# Patient Record
Sex: Female | Born: 1935 | Race: White | Hispanic: No | Marital: Married | Smoking: Never smoker
Health system: Southern US, Community
[De-identification: ages and names within clinical notes are randomized; demographics above are authoritative.]

## PROBLEM LIST (undated history)

## (undated) DIAGNOSIS — I10 Essential (primary) hypertension: Secondary | ICD-10-CM

## (undated) DIAGNOSIS — C801 Malignant (primary) neoplasm, unspecified: Secondary | ICD-10-CM

## (undated) DIAGNOSIS — I4891 Unspecified atrial fibrillation: Secondary | ICD-10-CM

## (undated) DIAGNOSIS — I219 Acute myocardial infarction, unspecified: Secondary | ICD-10-CM

## (undated) DIAGNOSIS — M810 Age-related osteoporosis without current pathological fracture: Secondary | ICD-10-CM

## (undated) DIAGNOSIS — E785 Hyperlipidemia, unspecified: Secondary | ICD-10-CM

## (undated) DIAGNOSIS — M1990 Unspecified osteoarthritis, unspecified site: Secondary | ICD-10-CM

## (undated) DIAGNOSIS — F419 Anxiety disorder, unspecified: Secondary | ICD-10-CM

## (undated) DIAGNOSIS — D649 Anemia, unspecified: Secondary | ICD-10-CM

## (undated) DIAGNOSIS — C50919 Malignant neoplasm of unspecified site of unspecified female breast: Secondary | ICD-10-CM

## (undated) DIAGNOSIS — Z923 Personal history of irradiation: Secondary | ICD-10-CM

## (undated) DIAGNOSIS — E119 Type 2 diabetes mellitus without complications: Secondary | ICD-10-CM

## (undated) DIAGNOSIS — I2510 Atherosclerotic heart disease of native coronary artery without angina pectoris: Secondary | ICD-10-CM

## (undated) HISTORY — PX: BLADDER SURGERY: SHX569

## (undated) HISTORY — PX: ABDOMINAL HYSTERECTOMY: SHX81

## (undated) HISTORY — PX: APPENDECTOMY: SHX54

---
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
## 1998-04-08 HISTORY — PX: COLON SURGERY: SHX602

## 2004-02-07 DIAGNOSIS — I219 Acute myocardial infarction, unspecified: Secondary | ICD-10-CM

## 2004-02-07 HISTORY — DX: Acute myocardial infarction, unspecified: I21.9

## 2004-02-22 ENCOUNTER — Other Ambulatory Visit: Payer: Self-pay

## 2004-04-08 ENCOUNTER — Encounter: Payer: Self-pay | Admitting: Internal Medicine

## 2004-05-09 ENCOUNTER — Encounter: Payer: Self-pay | Admitting: Internal Medicine

## 2004-05-24 ENCOUNTER — Ambulatory Visit: Payer: Self-pay | Admitting: Internal Medicine

## 2004-06-19 ENCOUNTER — Ambulatory Visit: Payer: Self-pay

## 2004-07-20 ENCOUNTER — Inpatient Hospital Stay: Payer: Self-pay | Admitting: Surgery

## 2005-06-12 ENCOUNTER — Ambulatory Visit: Payer: Self-pay | Admitting: Internal Medicine

## 2006-06-27 ENCOUNTER — Ambulatory Visit: Payer: Self-pay | Admitting: Internal Medicine

## 2007-08-13 ENCOUNTER — Ambulatory Visit: Payer: Self-pay | Admitting: Internal Medicine

## 2007-09-10 ENCOUNTER — Other Ambulatory Visit: Payer: Self-pay

## 2007-09-10 ENCOUNTER — Inpatient Hospital Stay: Payer: Self-pay | Admitting: Internal Medicine

## 2008-08-17 ENCOUNTER — Ambulatory Visit: Payer: Self-pay | Admitting: Internal Medicine

## 2009-05-05 ENCOUNTER — Inpatient Hospital Stay: Payer: Self-pay | Admitting: Internal Medicine

## 2009-08-18 ENCOUNTER — Ambulatory Visit: Payer: Self-pay | Admitting: Internal Medicine

## 2010-08-25 ENCOUNTER — Ambulatory Visit: Payer: Self-pay | Admitting: Internal Medicine

## 2011-09-13 ENCOUNTER — Ambulatory Visit: Payer: Self-pay | Admitting: Internal Medicine

## 2012-09-16 ENCOUNTER — Ambulatory Visit: Payer: Self-pay | Admitting: Internal Medicine

## 2013-07-03 ENCOUNTER — Observation Stay: Payer: Self-pay | Admitting: Specialist

## 2013-07-03 LAB — TROPONIN I
Troponin-I: <0.02 ng/mL
Troponin-I: <0.02 ng/mL

## 2013-07-03 LAB — CBC
MCH: 30.1 pg (ref 26.0–34.0)
MCHC: 34 g/dL (ref 32.0–36.0)
Platelet: 144 10*3/uL — ABNORMAL LOW (ref 150–440)
RBC: 4.43 10*6/uL (ref 3.80–5.20)

## 2013-07-03 LAB — BASIC METABOLIC PANEL
BUN: 26 mg/dL — ABNORMAL HIGH (ref 7–18)
Calcium, Total: 9 mg/dL (ref 8.5–10.1)
Chloride: 98 mmol/L (ref 98–107)
Co2: 26 mmol/L (ref 21–32)
EGFR (Non-African Amer.): 37 — ABNORMAL LOW
Glucose: 292 mg/dL — ABNORMAL HIGH (ref 65–99)
Osmolality: 276 (ref 275–301)
Potassium: 4.1 mmol/L (ref 3.5–5.1)

## 2013-07-03 LAB — PROTIME-INR: INR: 3.7

## 2013-07-04 LAB — TROPONIN I
Troponin-I: 0.03 ng/mL
Troponin-I: 0.03 ng/mL

## 2013-07-04 LAB — PROTIME-INR: Prothrombin Time: 28 secs — ABNORMAL HIGH (ref 11.5–14.7)

## 2013-07-05 LAB — CBC WITH DIFFERENTIAL/PLATELET
Basophil #: 0 10*3/uL (ref 0.0–0.1)
Eosinophil #: 0.5 10*3/uL (ref 0.0–0.7)
Eosinophil %: 5.8 %
HCT: 37 % (ref 35.0–47.0)
HGB: 12.7 g/dL (ref 12.0–16.0)
Lymphocyte %: 9.1 %
Monocyte #: 0.6 x10 3/mm (ref 0.2–0.9)
Monocyte %: 7.8 %
Neutrophil #: 6.2 10*3/uL (ref 1.4–6.5)
Neutrophil %: 76.9 %
RBC: 4.24 10*6/uL (ref 3.80–5.20)
WBC: 8.1 10*3/uL (ref 3.6–11.0)

## 2013-07-05 LAB — BASIC METABOLIC PANEL
Anion Gap: 8 (ref 7–16)
BUN: 19 mg/dL — ABNORMAL HIGH (ref 7–18)
Co2: 25 mmol/L (ref 21–32)
Creatinine: 0.94 mg/dL (ref 0.60–1.30)
EGFR (African American): >60
EGFR (Non-African Amer.): 59 — ABNORMAL LOW
Osmolality: 266 (ref 275–301)
Potassium: 4 mmol/L (ref 3.5–5.1)
Sodium: 131 mmol/L — ABNORMAL LOW (ref 136–145)

## 2013-07-05 LAB — PROTIME-INR
INR: 2
Prothrombin Time: 22.3 secs — ABNORMAL HIGH (ref 11.5–14.7)

## 2013-07-05 LAB — DIGOXIN LEVEL: Digoxin: 0.53 ng/mL

## 2013-09-17 ENCOUNTER — Ambulatory Visit: Payer: Self-pay | Admitting: Internal Medicine

## 2013-09-23 ENCOUNTER — Ambulatory Visit: Payer: Self-pay | Admitting: Internal Medicine

## 2013-11-11 DIAGNOSIS — I2510 Atherosclerotic heart disease of native coronary artery without angina pectoris: Secondary | ICD-10-CM | POA: Insufficient documentation

## 2013-11-11 DIAGNOSIS — M1990 Unspecified osteoarthritis, unspecified site: Secondary | ICD-10-CM | POA: Insufficient documentation

## 2014-03-30 ENCOUNTER — Ambulatory Visit: Payer: Self-pay | Admitting: Internal Medicine

## 2014-09-20 ENCOUNTER — Ambulatory Visit: Payer: Self-pay | Admitting: Internal Medicine

## 2014-10-22 ENCOUNTER — Other Ambulatory Visit: Payer: Self-pay | Admitting: Internal Medicine

## 2014-10-22 DIAGNOSIS — N63 Unspecified lump in unspecified breast: Secondary | ICD-10-CM

## 2014-10-29 NOTE — Discharge Summary (Signed)
Dates of Admission and Diagnosis:  Date of Admission 03-Jul-2013   Date of Discharge 05-Jul-2013   Admitting Diagnosis SOB, palpitations, Chronic atrial fibrillation with rapid ventricular response, HTN, DM   Final Diagnosis Chronic atrial fibrillation reverted to sinus rhythm, Hypertension, Type 2 diabetes   Discharge Diagnosis 1 Chronic atrial fibrillation reverted to sinus rhythm   2 Hypertension   3 Type 2 diabetes    Chief Complaint/History of Present Illness Patient presented to the ED with c/o SOB and palpitations. h/o Chronic atrial fibrillation on chronic Coumadin therapy, HTN, DM. Patient was found to be in atrial fibrillation with rapid ventricular response, 120-140 bpm in the ED. She received i.v Digoxin and Metoprolol was increased to 75 mg po bid. Sotalol was held. Patient was admitted to telemetry for monitoring and treatment.   TDMs:  28-Dec-14 07:07   Digoxin, Serum 0.53 (Therapeutic range for digoxin in patients with atrial fibrillation: 0.8 - 2.0 ng/mL. In patients with congestive heart failure a therapeutic range of 0.5 - 0.8 ng/mL is suggested as higher levels are associated with an increased risk of toxicity without clear evidence of enhanced efficacy. Digoxin toxicity is commonly associated with serum levels > 2.0 ng/mL but may occur with lower levels, including those in the therapeutic range. Blood samples should be obtained 6-8 hours after administration to assure a reasonable volume of distribution.)  Cardiology:  27-Dec-14 08:38   Echo Doppler REASON FOR EXAM:     COMMENTS:     PROCEDURE: [facility] - ECHO DOPPLER COMPLETE(TRANSTHOR)  - Jul 04 2013  8:38AM   RESULT: Echocardiogram Report  Patient Name:   Pamela Hensley Date of Exam: 07/04/2013 Medical Rec #:  [id number]      Custom1: Date of Birth:  20-Sep-1934   Height:       61.0 in Patient Age:    80 years    Weight:       134.0 lb Patient Gender: F           BSA:          1.59 m??  Indications: Atrial  Fib Sonographer:    Arville Go RDCS Referring Phys: Emily Filbert, F  Sonographer Comments: Technically difficult study due to poor echo  windows.  Summary:  1. Left ventricular ejection fraction, by visual estimation, is 50 to  55%.  2. Normal global left ventricular systolic function.  3. Mild thickening of the anterior and posterior mitral valve leaflets. 2D AND M-MODE MEASUREMENTS (normal ranges within parentheses): Left Ventricle:          Normal IVSd (2D):      0.93 cm (0.7-1.1) LVPWd (2D):     1.02 cm (0.7-1.1) Aorta/LA:                  Normal LVIDd (2D):     4.61 cm (3.4-5.7) Aortic Root (2D): 3.10 cm (2.4-3.7) LVIDs (2D):     3.43 cm           Left Atrium (2D): 3.70 cm (1.9-4.0) LV FS (2D):     25.6 %   (>25%) LV EF (2D):     50.5 %   (>50%)                                   Right Ventricle:  RVd (2D): LV DIASTOLIC FUNCTION: MV Peak E: 0.64 m/s Decel Time: 280 msec MV Peak A: 0.79 m/s E/A Ratio: 0.81 SPECTRAL DOPPLER ANALYSIS (where applicable): Mitral Valve: MV P1/2 Time: 81.20 msec MV Area, PHT: 2.71 cm?? Aortic Valve: AoV Max Vel: 1.34 m/s AoV Peak PG: 7.2 mmHg AoV Mean PG: LVOT Vmax: 1.18 m/s LVOT VTI:  LVOT Diameter: 2.30 cm AoV Area, Vmax: 3.66 cm?? AoV Area, VTI:  AoV Area, Vmn:  PHYSICIAN INTERPRETATION: Left Ventricle: The left ventricular internal cavity size was normal. LV  septal wall thickness was normal. LV posterior wall thickness was normal.  Global LV systolic function was normal. Left ventricular ejection  fraction, by visual estimation, is 50 to 55%. Right Ventricle: The right ventricular size is normal. Global RV systolic  function is normal. Left Atrium: The left atrium is normal in size. Right Atrium: The right atrium is normal in size. Pericardium: There is no evidence of pericardial effusion. Mitral Valve: The mitral valve is normal in structure. There is mild  thickening of the anterior and posterior  mitral valve leaflets. Trace  mitral valve regurgitation is seen. Tricuspid Valve: The tricuspid valve is normal. No tricuspid  regurgitation is visualized. Aortic Valve: The aortic valve is normal. Pulmonic Valve: The pulmonic valve is normal.  [place] MD Electronically signed by [place] MD Signature Date/Time: 07/04/2013/2:14:52 PM  *** Final ***  IMPRESSION: .    Verified By: Yolonda Kida, M.D., MD  Routine Chem:  26-Dec-14 10:25   Glucose, Serum  292  BUN  26  Creatinine (comp)  1.39  Sodium, Serum  130  Potassium, Serum 4.1  Chloride, Serum 98  CO2, Serum 26  Calcium (Total), Serum 9.0  Anion Gap  6  Osmolality (calc) 276  eGFR (African American)  43  eGFR (Non-African American)  37 (eGFR values <9mL/min/1.73 m2 may be an indication of chronic kidney disease (CKD). Calculated eGFR is useful in patients with stable renal function. The eGFR calculation will not be reliable in acutely ill patients when serum creatinine is changing rapidly. It is not useful in  patients on dialysis. The eGFR calculation may not be applicable to patients at the low and high extremes of body sizes, pregnant women, and vegetarians.)  28-Dec-14 07:07   Glucose, Serum  121  BUN  19  Creatinine (comp) 0.94  Sodium, Serum  131  Potassium, Serum 4.0  Chloride, Serum 98  CO2, Serum 25  Calcium (Total), Serum 9.1  Anion Gap 8  Osmolality (calc) 266  eGFR (African American) >60  eGFR (Non-African American)  59 (eGFR values <3mL/min/1.73 m2 may be an indication of chronic kidney disease (CKD). Calculated eGFR is useful in patients with stable renal function. The eGFR calculation will not be reliable in acutely ill patients when serum creatinine is changing rapidly. It is not useful in  patients on dialysis. The eGFR calculation may not be applicable to patients at the low and high extremes of body sizes, pregnant women, and vegetarians.)  Cardiac:   26-Dec-14 10:25   Troponin I < 0.02 (0.00-0.05 0.05 ng/mL or less: NEGATIVE  Repeat testing in 3-6 hrs  if clinically indicated. >0.05 ng/mL: POTENTIAL  MYOCARDIAL INJURY. Repeat  testing in 3-6 hrs if  clinically indicated. NOTE: An increase or decrease  of 30% or more on serial  testing suggests a  clinically important change)    21:27   Troponin I < 0.02 (0.00-0.05 0.05 ng/mL or less: NEGATIVE  Repeat testing  in 3-6 hrs  if clinically indicated. >0.05 ng/mL: POTENTIAL  MYOCARDIAL INJURY. Repeat  testing in 3-6 hrs if  clinically indicated. NOTE: An increase or decrease  of 30% or more on serial  testing suggests a  clinically important change)  27-Dec-14 01:37   Troponin I 0.03 (0.00-0.05 0.05 ng/mL or less: NEGATIVE  Repeat testing in 3-6 hrs  if clinically indicated. >0.05 ng/mL: POTENTIAL  MYOCARDIAL INJURY. Repeat  testing in 3-6 hrs if  clinically indicated. NOTE: An increase or decrease  of 30% or more on serial  testing suggests a  clinically important change)    05:42   Troponin I 0.03 (0.00-0.05 0.05 ng/mL or less: NEGATIVE  Repeat testing in 3-6 hrs  if clinically indicated. >0.05 ng/mL: POTENTIAL  MYOCARDIAL INJURY. Repeat  testing in 3-6 hrs if  clinically indicated. NOTE: An increase or decrease  of 30% or more on serial  testing suggests a  clinically important change)  Routine Coag:  26-Dec-14 10:25   Prothrombin  35.2  INR 3.7 (INR reference interval applies to patients on anticoagulant therapy. A single INR therapeutic range for coumarins is not optimal for all indications; however, the suggested range for most indications is 2.0 - 3.0. Exceptions to the INR Reference Range may include: Prosthetic heart valves, acute myocardial infarction, prevention of myocardial infarction, and combinations of aspirin and anticoagulant. The need for a higher or lower target INR must be assessed individually. Reference: The Pharmacology and  Management of the Vitamin K  antagonists: the seventh ACCP Conference on Antithrombotic and Thrombolytic Therapy. WCBJS.2831 Sept:126 (3suppl): [id number]. A HCT value >55% may artifactually increase the PT.  In one study,  the increase was an average of 25%. Reference:  "Effect on Routine and Special Coagulation Testing Values of Citrate Anticoagulant Adjustment in Patients with High HCT Values." American Journal of Clinical Pathology 2006;126:400-405.)  27-Dec-14 15:12   Prothrombin  28.0  INR 2.7 (INR reference interval applies to patients on anticoagulant therapy. A single INR therapeutic range for coumarins is not optimal for all indications; however, the suggested range for most indications is 2.0 - 3.0. Exceptions to the INR Reference Range may include: Prosthetic heart valves, acute myocardial infarction, prevention of myocardial infarction, and combinations of aspirin and anticoagulant. The need for a higher or lower target INR must be assessed individually. Reference: The Pharmacology and Management of the Vitamin K  antagonists: the seventh ACCP Conference on Antithrombotic and Thrombolytic Therapy. [id number] Sept:126 (3suppl): [id number]. A HCT value >55% may artifactually increase the PT.  In one study,  the increase was an average of 25%. Reference:  "Effect on Routine and Special Coagulation Testing Values of Citrate Anticoagulant Adjustment in Patients with High HCT Values." American Journal of Clinical Pathology 2006;126:400-405.)  28-Dec-14 07:07   Prothrombin  22.3  INR 2.0 (INR reference interval applies to patients on anticoagulant therapy. A single INR therapeutic range for coumarins is not optimal for all indications; however, the suggested range for most indications is 2.0 - 3.0. Exceptions to the INR Reference Range may include: Prosthetic heart valves, acute myocardial infarction, prevention of myocardial infarction, and combinations of aspirin and  anticoagulant. The need for a higher or lower target INR must be assessed individually. Reference: The Pharmacology and Management of the Vitamin K  antagonists: the seventh ACCP Conference on Antithrombotic and Thrombolytic Therapy. [id number] Sept:126 (3suppl): [id number]. A HCT value >55% may artifactually increase the PT.  In one study,  the increase was an average of 25%. Reference:  "  Effect on Routine and Special Coagulation Testing Values of Citrate Anticoagulant Adjustment in Patients with High HCT Values." American Journal of Clinical Pathology 2006;126:400-405.)  Routine Hem:  26-Dec-14 10:25   WBC (CBC) 6.3  RBC (CBC) 4.43  Hemoglobin (CBC) 13.3  Hematocrit (CBC) 39.2  Platelet Count (CBC)  144 (Result(s) reported on 03 Jul 2013 at 10:40AM.)  MCV 89  MCH 30.1  MCHC 34.0  RDW 13.5  28-Dec-14 07:07   WBC (CBC) 8.1  RBC (CBC) 4.24  Hemoglobin (CBC) 12.7  Hematocrit (CBC) 37.0  Platelet Count (CBC)  115  MCV 87  MCH 29.9  MCHC 34.3  RDW 13.3  Neutrophil % 76.9  Lymphocyte % 9.1  Monocyte % 7.8  Eosinophil % 5.8  Basophil % 0.4  Neutrophil # 6.2  Lymphocyte #  0.7  Monocyte # 0.6  Eosinophil # 0.5  Basophil # 0.0 (Result(s) reported on 05 Jul 2013 at 08:45AM.)   Hospital Course:  Hospital Course She remained hemodynamically stable. Coumadin was held for INR of 3.7 on admission. Patient reverted to normal sinus rhythm the next morning. Oral Cardizem was not needed. ECHO revealed EF=50-55%. Patient remained in sinus rhythm at 60-70 bpm during the rest of her hospital stay with normal BP readings. DM was controlled.   Condition on Discharge Stable   DISCHARGE INSTRUCTIONS HOME MEDS:  Medication Reconciliation: Patient's Home Medications at Discharge:     Medication Instructions  lisinopril 40 mg oral tablet  0.5 tab(s) orally once a day   metformin 500 mg oral tablet  1 tab(s) orally once a day with food.   warfarin 6 mg oral tablet  1 tab(s) orally once a day  (at bedtime)   simvastatin 40 mg oral tablet  1 tab(s) orally once a day (at bedtime) for cholesterol.   aspirin enteric coated 81 mg oral delayed release tablet  1 tab(s) orally once a day   venlafaxine 75 mg oral tablet  1 tab(s) orally once a day   acetaminophen 325 mg oral tablet  2 tab(s) orally every 4 hours, As needed, pain or temp. greater than 100.4   digoxin 125 mcg (0.125 mg) oral tablet  1 tab(s) orally once a day   metoprolol tartrate 25 mg oral tablet  3 tab(s) orally every 12 hours    PRESCRIPTIONS: ELECTRONICALLY SUBMITTED   Physician's Instructions:  Diet Low Sodium  Carbohydrate Controlled (ADA) Diet   Activity Limitations As tolerated   Return to Work Not Applicable   Time frame for Follow Up Appointment 1-2 weeks  Dr. Emily Filbert   Electronic Signatures: Glendon Axe (MD)  (Signed 28-Dec-14 11:30)  Authored: ADMISSION DATE AND DIAGNOSIS, CHIEF COMPLAINT/HPI, PERTINENT [place] MEDS, PATIENT INSTRUCTIONS   Last Updated: 28-Dec-14 11:30 by Glendon Axe (MD)

## 2014-10-30 NOTE — H&P (Signed)
PATIENT NAME:  Pamela Hensley, Pamela Hensley MR#:  [id number] DATE OF BIRTH:  [birth year]  DATE OF ADMISSION:  07/03/2013  PRIMARY CARE PHYSICIAN: Dr. Emily Filbert.   CHIEF COMPLAINT: Palpitations and shortness of breath.   HISTORY OF PRESENT ILLNESS: This is a 80 year old female who presents to the hospital due to palpitations and shortness of breath, ongoing for the past 2 to 3 days. The patient has a history of chronic atrial fibrillation. She was told by her primary care physician that if her rate is uncontrolled to take an extra dose of sotalol and lower one of her blood pressure meds. She attempted to do that, but she continued to have significant palpitations on exertion and shortness of breath. She, therefore, came to the ER for further evaluation and was noted to be in uncontrolled Afib with heart rates in the 120s to 140s. She received some IV metoprolol, but her rate still continued to be high and hospitalist service was contacted for further treatment and evaluation. The patient said she had an episode of palpitations and shortness of breath about 3 weeks ago and she attempted to go up on her sotalol which seemed to have alleviated her symptoms, although they have recurred now 3 weeks later again. She denies any chest pain. She denies any paroxysmal nocturnal dyspnea, any orthopnea, any nausea, vomiting, syncope, diaphoresis or any other associated symptoms presently.   REVIEW OF SYSTEMS:   CONSTITUTIONAL: No documented fever. No weight gain. No weight loss.  EYES: No blurry or double vision.  ENT: No tinnitus. No postnasal drip. No redness of the oropharynx.  RESPIRATORY: No cough. No wheeze. No hemoptysis. Positive dyspnea on exertion.  CARDIOVASCULAR: No chest pain. No orthopnea. Positive palpitations. No syncope.  GASTROINTESTINAL: No nausea. No vomiting. No diarrhea. No abdominal pain. No melena or hematochezia.  GENITOURINARY: No dysuria or hematuria.  ENDOCRINE: No polyuria, nocturia, heat or cold  intolerance.  HEMATOLOGIC: No anemia. No bruising. No bleeding.  INTEGUMENTARY: No rashes. No lesions.  MUSCULOSKELETAL: No arthritis. No swelling. No gout.  NEUROLOGIC: No numbness. No tingling. No ataxia. No seizure-type activity.  PSYCHIATRIC: No anxiety. No insomnia. No ADD.   PAST MEDICAL HISTORY: Consistent with chronic Afib, hypertension, diabetes, hyperlipidemia, depression.   ALLERGIES: No known drug allergies.   SOCIAL HISTORY: No smoking. No alcohol abuse. No illicit drug abuse. Lives at home with her husband.   FAMILY HISTORY: Both mother and father are deceased. Mother died from [place]. Father died from old age.   CURRENT MEDICATIONS: Aspirin 81 mg daily, HCTZ 25 mg daily, lisinopril 20 mg daily, metformin 500 mg daily, metoprolol tartrate 50 mg b.i.d., simvastatin 40 mg at bedtime, sotalol 80 mg t.i.d., Effexor 75 mg daily and Coumadin 6 mg at bedtime.   PHYSICAL EXAMINATION: Presently is as follows: VITAL SIGNS: Not to be temperature is 96, pulse 105, respirations 18, blood pressure 111/63, sats 96% on room air.  GENERAL: The patient is a pleasant-appearing female in no apparent distress.  HEAD, EYES, EARS, NOSE, THROAT: She is atraumatic, normocephalic. Extraocular muscles are intact. Pupils equal and reactive to light. Sclerae are anicteric. No conjunctival injection. No pharyngeal erythema.  NECK: Supple. There is no jugular venous distention. No bruits. No lymphadenopathy or thyromegaly.  HEART: Irregularly irregular. No murmurs. No rubs. No clicks.  LUNGS: Clear to auscultation bilaterally. No rales or rhonchi. No wheezes.  ABDOMEN: Soft, flat, nontender, nondistended. Has good bowel sounds. No hepatosplenomegaly appreciated.  EXTREMITIES: No evidence of any cyanosis, clubbing or peripheral edema. Has +  2 pedal and radial pulses bilaterally.  NEUROLOGICAL: The patient is alert, awake and oriented x 3 with no focal motor or sensory deficits appreciated bilaterally.   SKIN: Moist and warm with no rashes appreciated.  LYMPHATIC: There is no cervical or axillary lymphadenopathy.   LABORATORY EXAM: Shows a serum glucose of 292, BUN 26, creatinine 1.3, sodium 130, potassium 4.1, chloride 98, bicarb 26. The patient's troponin is less than 0.02. White cell count 6.3, hemoglobin 13.3, hematocrit 39.2, platelet count of 144. INR is 3.7.   EKG shows atrial fibrillation with rapid ventricular response.   ASSESSMENT AND PLAN: This is a 80 year old female with a history of chronic atrial fibrillation, hypertension, depression, diabetes. Presents to the hospital due to uncontrolled atrial fibrillation.  1. Atrial fibrillation: The patient has chronic atrial fibrillation. Her rate is uncontrolled. She apparently has been on sotalol for rate control, although is not improving her symptoms. For now, I will increase her oral metoprolol, add some intravenous digoxin. Also place her on some low-dose Cardizem and add p.r.n. intravenous Cardizem for rates greater than 140. I will get a cardiology consult. Discussed the case with Dr. Clayborn Bigness who will see the patient tomorrow. I will get a 2-dimensional echocardiogram. The patient clinically does not appear be in congestive heart failure. The patient is already on Coumadin. INR is therapeutic. I will hold Coumadin for now.  2. Hypertension: The patient is hemodynamically stable presently. I will continue her metoprolol.  3. Depression: Continue Effexor.  4. Diabetes: Continue her metformin and sliding scale insulin and a carbohydrate-controlled diet. Follow blood sugars.  5. Hyperlipidemia: Continue simvastatin.   The patient is a FULL CODE.   TIME SPENT: 50 minutes.    ____________________________ Belia Heman. Verdell Carmine, MD vjs:gb D: 07/03/2013 20:16:30 ET T: 07/03/2013 22:51:52 ET JOB#: [id number]  cc: Belia Heman. Verdell Carmine, MD, <Dictator> Henreitta Leber MD ELECTRONICALLY SIGNED 08/05/2013 11:16

## 2014-10-30 NOTE — Consult Note (Signed)
PATIENT NAME:  Pamela Hensley, Pamela Hensley MR#:  [id number] DATE OF BIRTH:  07/24/1934  DATE OF CONSULTATION:  07/04/2013  REFERRING PHYSICIAN:  Emily Filbert, M.D. CONSULTING PHYSICIAN:  Carita Sollars D. Tyronne Blann, MD  INDICATION:  Palpitations, shortness of breath.   HISTORY OF PRESENT ILLNESS:  Ms. Pamela Hensley is a 80 year old white female with a history of palpitations, shortness of breath, ongoing for the last 2 to 3 days.  The patient states that she has chronic atrial fibrillation.  She was told by primary that if her rate was uncontrolled to taken an extra dose of Sotalol and lower her blood pressure medications.  The patient attempted do so, but she continued to have significant palpitations on exertion, shortness of breath.  Therefore, came to the ER for further evaluation and was noted to have uncontrolled A-Fib and heart rates of 120s to 140s.  She received some IV metoprolol, but her rate still continued to be high and the hospitalist service was contacted for further evaluation.  The patient said that she had an episode of palpitations, shortness of breath about three weeks ago and attempted to go up on the sotalol which seemed to have alleviated symptoms, but now it has recurred again.  She denies any worsening dyspnea, shortness of breath and no recent problems with anticoagulation which she has been on consistently.   REVIEW OF SYSTEMS:   syncope.  Denies nausea, vomiting.  Denies fever, chills, sweats.  No weight loss.  No weight gain.  No hemoptysis or hematemesis.  She denies bright red blood per rectum.  No vision change or hearing change.  Denies sputum production or cough.  Again, she complains of palpitations, tachycardia, vague chest pain related to the palpitations.   PAST MEDICAL HISTORY:  Chronic atrial fibrillation, hypertension, diabetes, hyperlipidemia, depression.   ALLERGIES:  None.   SOCIAL HISTORY:  No smoking or alcohol abuse.  No illicit drug use.  Lives with her husband.   FAMILY HISTORY:   Mother and father died from [place].  MEDICATIONS AT HOME:  Aspirin 81 mg a day, HCTZ 25 daily, Lexapro 20 mg daily, metformin 500 mg daily, metoprolol 50 mg twice daily, simvastatin 40 at bedtime, Sotalol 80 twice daily, Effexor 75 daily, Coumadin 6 mg at bedtime.   PHYSICAL EXAMINATION: VITAL SIGNS:  Blood pressure was 120/60, pulse of about 100 and irregular, respiratory rate of 14, afebrile.  HEENT:  Normocephalic, atraumatic.  Pupils equal and reactive to light.  NECK:  Supple.  No significant JVD, bruits or adenopathy.  LUNGS:  Clear to auscultation and percussion.  No significant wheezing, rhonchi, or rales.  HEART:  Irregularly irregular, systolic ejection murmur at left sternal border. ABDOMEN:  Benign.  NEUROLOGIC:  Intact.  SKIN:  Normal.   LABORATORY, DIAGNOSTIC, AND RADIOLOGICAL DATA:  Glucose of 292, BUN 26, creatinine 1.3, sodium 130, potassium 4.1, chloride of 98, bicarb 26.  Troponin less than 0.2.  White count of 6, hemoglobin 13, hematocrit 39, platelet count of 144.  INR 3.7.  EKG:  Atrial fibrillation with rapid ventricular response.   ASSESSMENT:  1.  Chronic atrial fibrillation with rapid ventricular response.  2.  Chest pain.  3.  Hypertension.  4.  Diabetes.  5.  Depression.  6.  Hyperlipidemia.   PLAN:   1.  The patient has had chronic atrial fibrillation on anticoagulation, did attempt to control with sotalol with marginal success.  She has had some success with rate control, but not rhythm control.  She has been reasonably asymptomatic  until recently.  Now the question is for better control should we increase her sotalol to 120 twice a day or switch to a different medication for rhythm control.  Should we increase her metoprolol or add another rate controlling agent.  We will probably continue Coumadin as we are doing for anticoagulation.  Would also consider whether cardioversion would be helpful.  Echocardiogram would help in this instance and then make a  determination, we would consider whether EP evaluation would be helpful.  Do not get the sense that ablation would be a likely alternative.  2.  Hypertension.  We will continue possibly advancing blood pressure medications including metoprolol.  We will consider whether Cardizem will be helpful or some other agent.   3.  For chest pain, we will probably recommend a noninvasive functional study.  Some of the chest pain could be related to her palpitations  must consider with the coronary artery disease that is underlying some of her symptoms.  4.  For hyperlipidemia, continue simvastatin therapy.  5.  For diabetes, continue metformin.  6.  For depression, Effexor.    Again, hopefully we will proceed with a noninvasive outpatient evaluation and then consider whether further atrial fibrillation or EP evaluation is necessary as well.  Once her rate is better controlled we will make some decisions about her rhythm.    ____________________________ Loran Senters. Clayborn Bigness, MD ddc:ea D: 07/08/2013 02:58:19 ET T: 07/08/2013 03:46:38 ET JOB#: [id number]  cc: Shayne Diguglielmo D. Clayborn Bigness, MD, <Dictator> Yolonda Kida MD ELECTRONICALLY SIGNED 08/06/2013 16:22

## 2015-03-24 ENCOUNTER — Ambulatory Visit
Admission: RE | Admit: 2015-03-24 | Discharge: 2015-03-24 | Disposition: A | Discharge status: Home / Self Care | Payer: Medicare Other | Source: Ambulatory Visit | Attending: Internal Medicine | Admitting: Internal Medicine

## 2015-03-24 DIAGNOSIS — R928 Other abnormal and inconclusive findings on diagnostic imaging of breast: Secondary | ICD-10-CM | POA: Diagnosis present

## 2015-03-24 DIAGNOSIS — N63 Unspecified lump in unspecified breast: Secondary | ICD-10-CM

## 2015-04-06 ENCOUNTER — Encounter: Payer: Self-pay | Admitting: *Deleted

## 2015-04-11 ENCOUNTER — Encounter: Payer: Self-pay | Admitting: Anesthesiology

## 2015-04-13 NOTE — Discharge Instructions (Signed)

## 2015-04-15 ENCOUNTER — Ambulatory Visit
Admission: RE | Admit: 2015-04-15 | Discharge: 2015-04-15 | Disposition: A | Discharge status: Home / Self Care | Payer: Medicare Other | Source: Ambulatory Visit | Attending: Unknown Physician Specialty | Admitting: Unknown Physician Specialty

## 2015-04-15 ENCOUNTER — Encounter
Admission: RE | Disposition: A | Discharge status: Home / Self Care | Payer: Self-pay | Source: Ambulatory Visit | Attending: Unknown Physician Specialty

## 2015-04-15 DIAGNOSIS — I252 Old myocardial infarction: Secondary | ICD-10-CM | POA: Insufficient documentation

## 2015-04-15 DIAGNOSIS — Z9049 Acquired absence of other specified parts of digestive tract: Secondary | ICD-10-CM | POA: Insufficient documentation

## 2015-04-15 DIAGNOSIS — I10 Essential (primary) hypertension: Secondary | ICD-10-CM | POA: Diagnosis not present

## 2015-04-15 DIAGNOSIS — M1990 Unspecified osteoarthritis, unspecified site: Secondary | ICD-10-CM | POA: Insufficient documentation

## 2015-04-15 DIAGNOSIS — R221 Localized swelling, mass and lump, neck: Secondary | ICD-10-CM | POA: Diagnosis present

## 2015-04-15 DIAGNOSIS — I2510 Atherosclerotic heart disease of native coronary artery without angina pectoris: Secondary | ICD-10-CM | POA: Diagnosis not present

## 2015-04-15 DIAGNOSIS — L728 Other follicular cysts of the skin and subcutaneous tissue: Secondary | ICD-10-CM | POA: Diagnosis not present

## 2015-04-15 DIAGNOSIS — Z7982 Long term (current) use of aspirin: Secondary | ICD-10-CM | POA: Insufficient documentation

## 2015-04-15 DIAGNOSIS — Z90710 Acquired absence of both cervix and uterus: Secondary | ICD-10-CM | POA: Diagnosis not present

## 2015-04-15 DIAGNOSIS — E785 Hyperlipidemia, unspecified: Secondary | ICD-10-CM | POA: Insufficient documentation

## 2015-04-15 DIAGNOSIS — M810 Age-related osteoporosis without current pathological fracture: Secondary | ICD-10-CM | POA: Diagnosis not present

## 2015-04-15 DIAGNOSIS — I4891 Unspecified atrial fibrillation: Secondary | ICD-10-CM | POA: Diagnosis not present

## 2015-04-15 HISTORY — DX: Unspecified osteoarthritis, unspecified site: M19.90

## 2015-04-15 HISTORY — DX: Hyperlipidemia, unspecified: E78.5

## 2015-04-15 HISTORY — DX: Acute myocardial infarction, unspecified: I21.9

## 2015-04-15 HISTORY — PX: MASS EXCISION: SHX2000

## 2015-04-15 HISTORY — DX: Anemia, unspecified: D64.9

## 2015-04-15 HISTORY — DX: Age-related osteoporosis without current pathological fracture: M81.0

## 2015-04-15 HISTORY — DX: Unspecified atrial fibrillation: I48.91

## 2015-04-15 HISTORY — DX: Essential (primary) hypertension: I10

## 2015-04-15 HISTORY — DX: Type 2 diabetes mellitus without complications: E11.9

## 2015-04-15 LAB — GLUCOSE, CAPILLARY
Glucose-Capillary: 145 mg/dL — ABNORMAL HIGH (ref 65–99)
Glucose-Capillary: 150 mg/dL — ABNORMAL HIGH (ref 65–99)

## 2015-04-15 SURGERY — MINOR EXCISION OF MASS
Anesthesia: LOCAL | Laterality: Right | Wound class: Clean

## 2015-04-15 MED ORDER — LIDOCAINE-EPINEPHRINE 1 %-1:100000 IJ SOLN
INTRAMUSCULAR | Status: DC | PRN
  Administered 2015-04-15: 3 mL

## 2015-04-15 SURGICAL SUPPLY — 27 items
APPLICATOR COTTON TIP WD 3 STR (MISCELLANEOUS) IMPLANT
BLADE SURG 15 STRL LF DISP TIS (BLADE) IMPLANT
BLADE SURG 15 STRL SS (BLADE)
CANISTER SUCT 1200ML W/VALVE (MISCELLANEOUS) IMPLANT
CORD BIP STRL DISP 12FT (MISCELLANEOUS) ×2 IMPLANT
DRAPE HEAD BAR (DRAPES) IMPLANT
DRESSING TELFA 4X3 1S ST N-ADH (GAUZE/BANDAGES/DRESSINGS) IMPLANT
ELECT CAUTERY BLADE TIP 2.5 (TIP)
ELECT CAUTERY NEEDLE 2.0 MIC (NEEDLE) IMPLANT
ELECTRODE CAUTERY BLDE TIP 2.5 (TIP) IMPLANT
GAUZE SPONGE 4X4 12PLY STRL (GAUZE/BANDAGES/DRESSINGS) IMPLANT
GLOVE BIO SURGEON STRL SZ7.5 (GLOVE) ×4 IMPLANT
LIQUID BAND (GAUZE/BANDAGES/DRESSINGS) IMPLANT
NEEDLE HYPO 25GX1X1/2 BEV (NEEDLE) ×2 IMPLANT
NS IRRIG 500ML POUR BTL (IV SOLUTION) ×2 IMPLANT
PACK DRAPE NASAL/ENT (PACKS) ×2 IMPLANT
PAD GROUND ADULT SPLIT (MISCELLANEOUS) IMPLANT
PENCIL ELECTRO HAND CTR (MISCELLANEOUS) IMPLANT
SOL PREP PVP 2OZ (MISCELLANEOUS)
SOLUTION PREP PVP 2OZ (MISCELLANEOUS) IMPLANT
STRAP BODY AND KNEE 60X3 (MISCELLANEOUS) ×2 IMPLANT
SUCTION FRAZIER TIP 10 FR DISP (SUCTIONS) IMPLANT
SUT PROLENE 5 0 P 3 (SUTURE) IMPLANT
SUT VIC AB 4-0 RB1 27 (SUTURE) ×1
SUT VIC AB 4-0 RB1 27X BRD (SUTURE) ×1 IMPLANT
SYRINGE 10CC LL (SYRINGE) ×2 IMPLANT
TOWEL OR 17X26 4PK STRL BLUE (TOWEL DISPOSABLE) ×2 IMPLANT

## 2015-04-15 NOTE — H&P (Signed)
  H+P  Reviewed and will be scanned in later. No changes noted. 

## 2015-04-15 NOTE — Op Note (Signed)
04/15/2015  9:46 AM    Octavia Bruckner  [id number]   Pre-Op Dx: NECK MASS  Post-op Dx: SAME  Proc: Excision of 1 x 2 cm right anterior neck mass   Surg:  Beverly Gust T  Anes:  GOT  EBL:  Less than 5 cc  Comp:  None  Findings:  Approximately 1 x 2 cm superficial anterior neck mass on the right  Procedure: Miss Releford was done from the holding area taken the operating room placed in supine position. There was a mass located in the subcutaneous taste tissues of the right neck and local anesthetic of 1% lidocaine with 1 100,000 units epinephrine is used to inject around the cystic mass a total of 3 cc was used of the neck was then prepped and draped sterilely and elliptical incision was created around the mass within a natural skin tension line. A 15 blade was used to perform this incision. The subcutaneous tissues were dissected using short sharp scissors and the microbipolar. This was taken down in the subcutaneous fat and remove the cystic mass in its entirety. The wounds and copious irrigated with saline any small bleeding points were cauterized using the microbipolar. The subcutaneous taste tissues were closed using 4-0 Vicryl and the skin was closed with interrupted 4-0 nylon. The patient was then taken from the operating room to the recovery room in stable condition  Dispo:   Good  Specimen: Right anterior neck mass  Cultures: None  Plan:  She will be discharged home follow up in 1 week for suture removal  Rainah Kirshner T  04/15/2015 9:46 AM

## 2015-04-15 NOTE — OR Nursing (Signed)
Patients before procedure start BP- 98/52 Patients after procedure BP- 100/54

## 2015-04-18 ENCOUNTER — Encounter: Payer: Self-pay | Admitting: Unknown Physician Specialty

## 2015-04-19 LAB — SURGICAL PATHOLOGY

## 2015-08-24 DIAGNOSIS — E119 Type 2 diabetes mellitus without complications: Secondary | ICD-10-CM | POA: Insufficient documentation

## 2015-08-31 ENCOUNTER — Other Ambulatory Visit: Payer: Self-pay | Admitting: Internal Medicine

## 2015-08-31 DIAGNOSIS — R920 Mammographic microcalcification found on diagnostic imaging of breast: Secondary | ICD-10-CM

## 2015-09-23 ENCOUNTER — Ambulatory Visit
Admission: RE | Admit: 2015-09-23 | Discharge: 2015-09-23 | Disposition: A | Discharge status: Home / Self Care | Payer: Medicare Other | Source: Ambulatory Visit | Attending: Internal Medicine | Admitting: Internal Medicine

## 2015-09-23 DIAGNOSIS — R920 Mammographic microcalcification found on diagnostic imaging of breast: Secondary | ICD-10-CM | POA: Diagnosis present

## 2015-09-23 DIAGNOSIS — R921 Mammographic calcification found on diagnostic imaging of breast: Secondary | ICD-10-CM | POA: Diagnosis not present

## 2016-02-23 DIAGNOSIS — E782 Mixed hyperlipidemia: Secondary | ICD-10-CM | POA: Insufficient documentation

## 2016-03-14 IMAGING — MG MM ADDITIONAL VIEWS AT NO CHARGE
4 series · 8 of 8 positions shown · non-contrast
Comparison: With priors.

CLINICAL DATA: Abnormal right screening mammogram.

EXAM:
DIGITAL DIAGNOSTIC  RIGHT MAMMOGRAM

[R CC · right · 4 of 4 slices shown (1 of 2)]
[im 1/4]
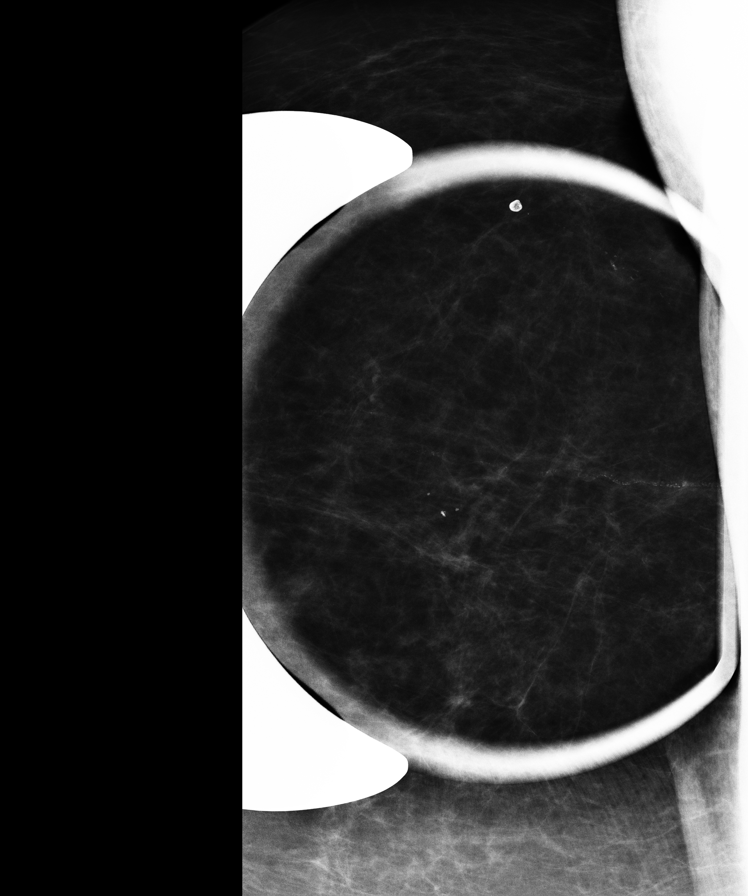
[im 2/4]
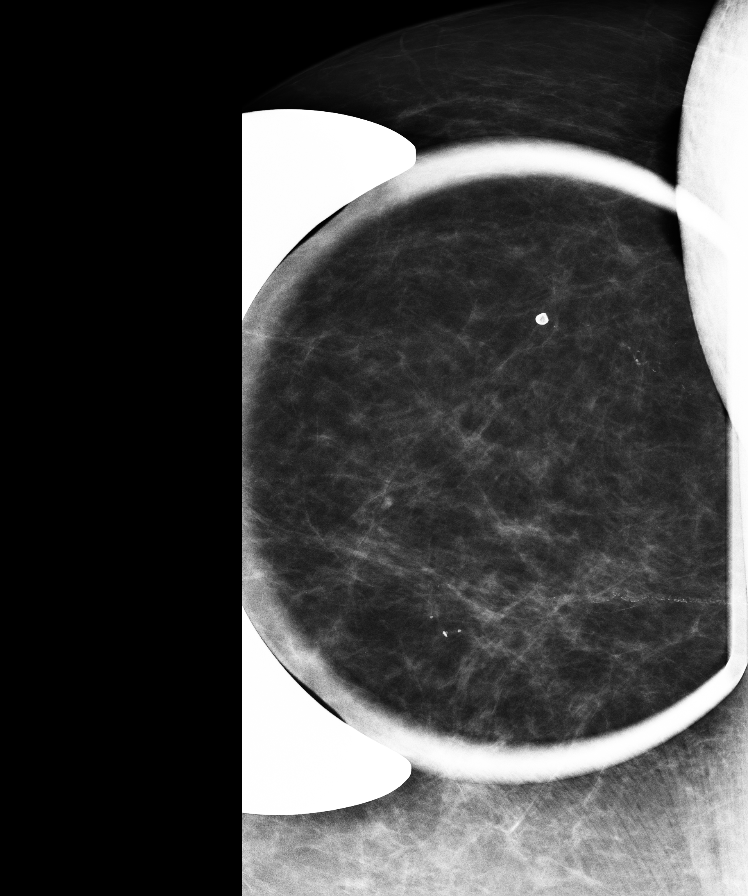
[im 3/4]
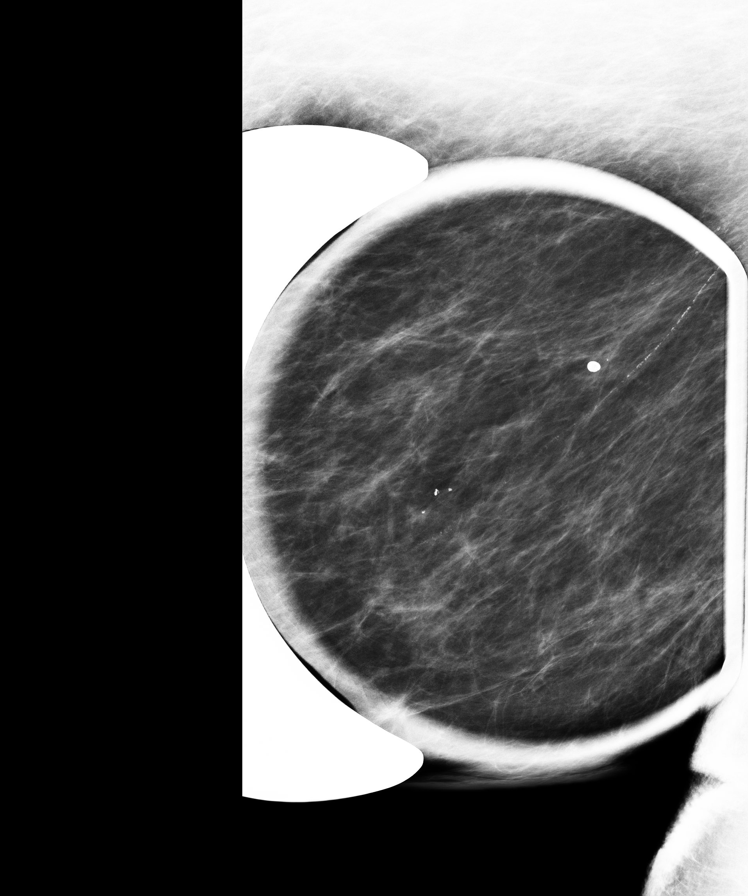
[im 4/4]
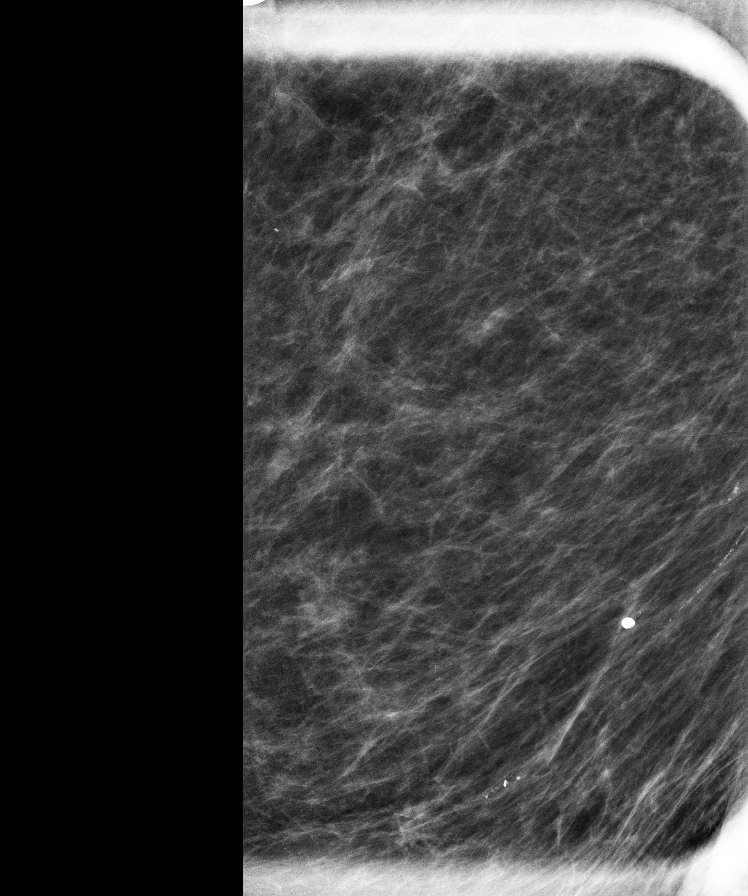

[R ML · right · 2 of 2 slices shown (1 of 2)]
[im 1/2]
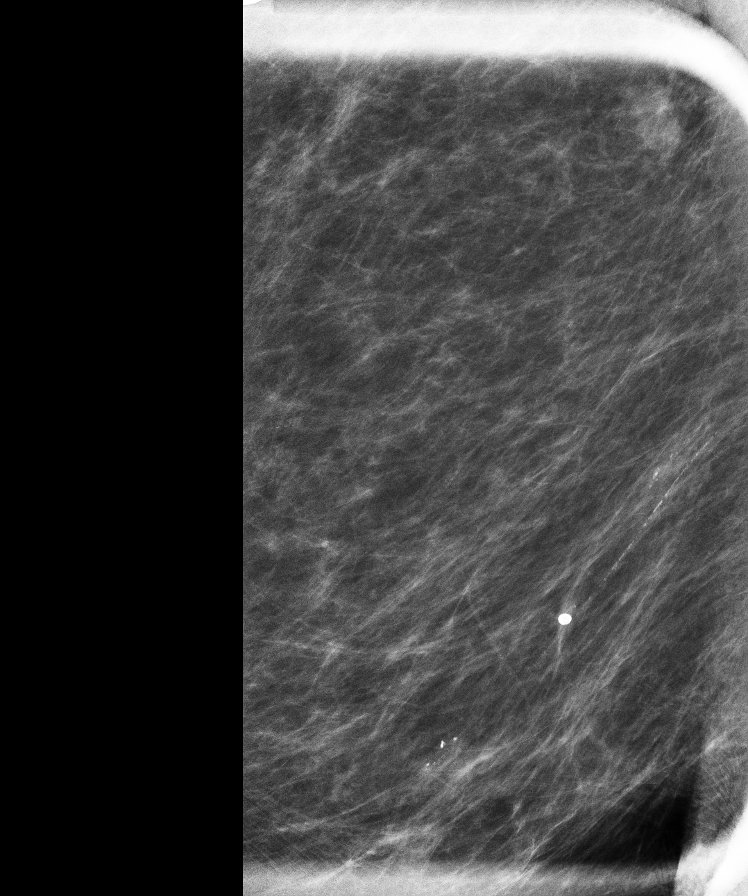
[im 2/2]
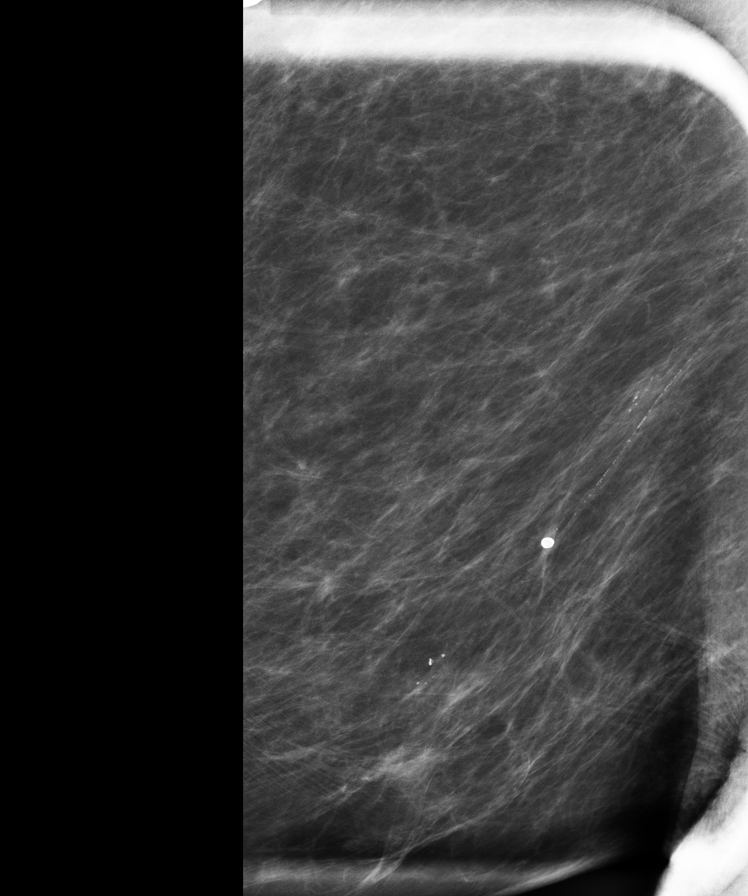

[R CC (2 of 2)]
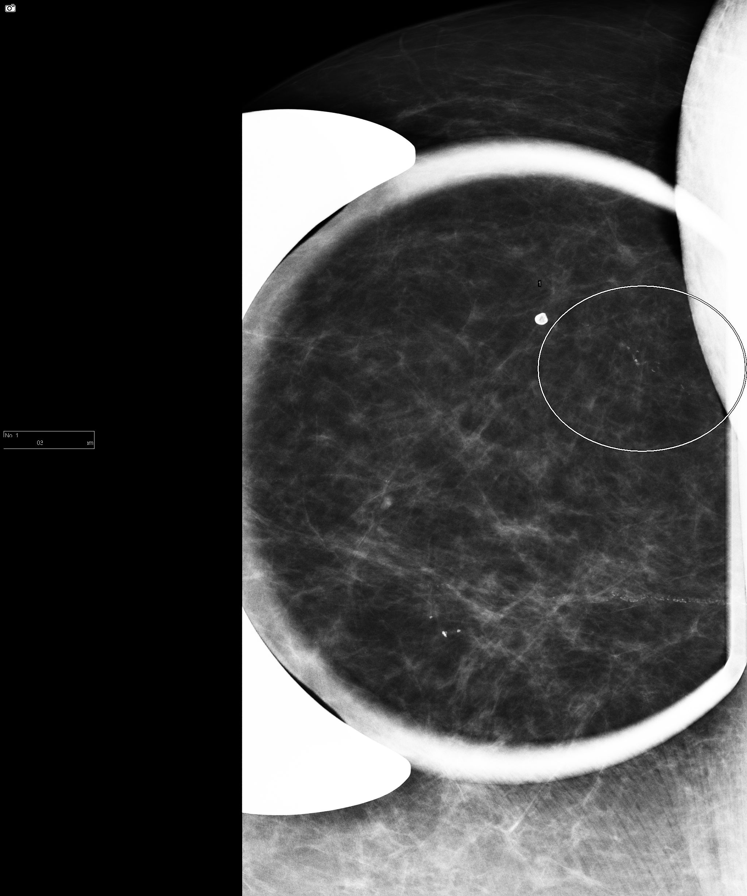

[R ML (2 of 2)]
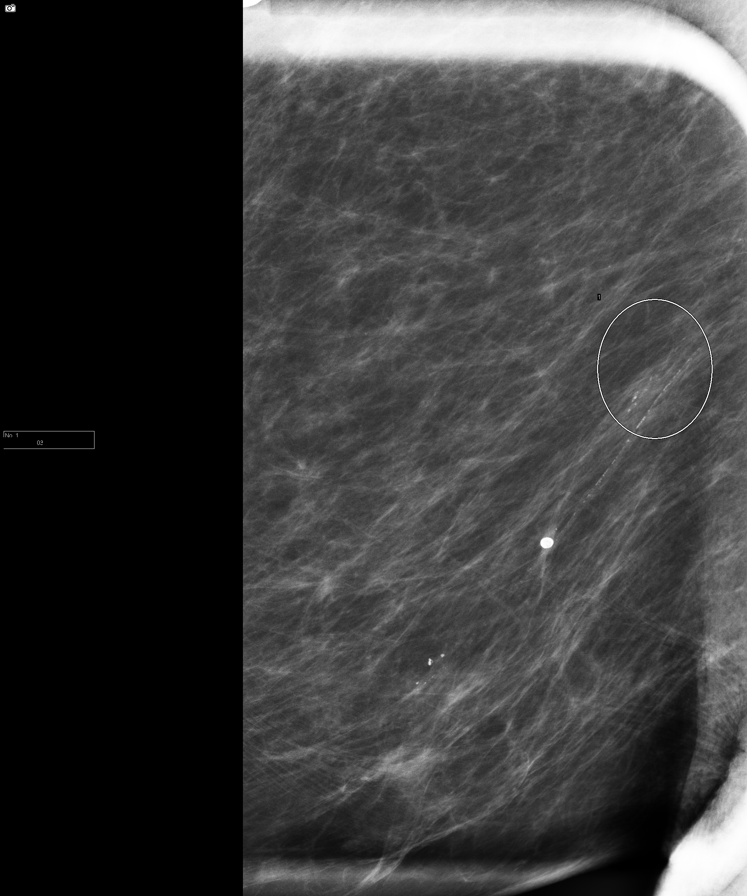

[8 of 8 positions shown; findings below may reference images not displayed]

ACR Breast Density Category b: There are scattered areas of
fibroglandular density.
FINDINGS: Magnification views of the upper-outer quadrant of the right breast
were obtained. There are developing calcifications in the posterior
third of the breast. They have a tram track appearance to them and
are felt to likely be vascular.
IMPRESSION: Probable benign vascular calcifications in the right breast.

RECOMMENDATION:
Short-term interval followup right diagnostic mammogram in 6 months
is recommended.

I have discussed the findings and recommendations with the patient.
Results were also provided in writing at the conclusion of the
visit. If applicable, a reminder letter will be sent to the patient
regarding the next appointment.

BI-RADS CATEGORY  3: Probably benign finding(s) - short interval
follow-up suggested.

## 2016-07-09 DIAGNOSIS — C50919 Malignant neoplasm of unspecified site of unspecified female breast: Secondary | ICD-10-CM

## 2016-07-09 HISTORY — DX: Malignant neoplasm of unspecified site of unspecified female breast: C50.919

## 2016-08-27 ENCOUNTER — Other Ambulatory Visit: Payer: Self-pay | Admitting: Internal Medicine

## 2016-08-27 DIAGNOSIS — M810 Age-related osteoporosis without current pathological fracture: Secondary | ICD-10-CM | POA: Insufficient documentation

## 2016-08-27 DIAGNOSIS — Z0000 Encounter for general adult medical examination without abnormal findings: Secondary | ICD-10-CM | POA: Insufficient documentation

## 2016-08-27 DIAGNOSIS — I482 Chronic atrial fibrillation, unspecified: Secondary | ICD-10-CM | POA: Insufficient documentation

## 2016-08-27 DIAGNOSIS — R921 Mammographic calcification found on diagnostic imaging of breast: Secondary | ICD-10-CM

## 2016-10-11 ENCOUNTER — Ambulatory Visit
Admission: RE | Admit: 2016-10-11 | Discharge: 2016-10-11 | Disposition: A | Discharge status: Home / Self Care | Payer: Medicare Other | Source: Ambulatory Visit | Attending: Internal Medicine | Admitting: Internal Medicine

## 2016-10-11 DIAGNOSIS — R921 Mammographic calcification found on diagnostic imaging of breast: Secondary | ICD-10-CM | POA: Diagnosis present

## 2016-10-11 DIAGNOSIS — N6313 Unspecified lump in the right breast, lower outer quadrant: Secondary | ICD-10-CM | POA: Diagnosis not present

## 2016-10-15 ENCOUNTER — Other Ambulatory Visit: Payer: Self-pay | Admitting: Internal Medicine

## 2016-10-15 DIAGNOSIS — R928 Other abnormal and inconclusive findings on diagnostic imaging of breast: Secondary | ICD-10-CM

## 2016-10-15 DIAGNOSIS — N6310 Unspecified lump in the right breast, unspecified quadrant: Secondary | ICD-10-CM

## 2016-10-22 ENCOUNTER — Ambulatory Visit
Admission: RE | Admit: 2016-10-22 | Discharge: 2016-10-22 | Disposition: A | Discharge status: Home / Self Care | Payer: Medicare Other | Source: Ambulatory Visit | Attending: Internal Medicine | Admitting: Internal Medicine

## 2016-10-22 DIAGNOSIS — R928 Other abnormal and inconclusive findings on diagnostic imaging of breast: Secondary | ICD-10-CM

## 2016-10-22 DIAGNOSIS — N6310 Unspecified lump in the right breast, unspecified quadrant: Secondary | ICD-10-CM

## 2016-10-22 DIAGNOSIS — C50511 Malignant neoplasm of lower-outer quadrant of right female breast: Secondary | ICD-10-CM | POA: Diagnosis not present

## 2016-10-22 HISTORY — PX: BREAST BIOPSY: SHX20

## 2016-10-25 DIAGNOSIS — C50511 Malignant neoplasm of lower-outer quadrant of right female breast: Secondary | ICD-10-CM | POA: Insufficient documentation

## 2016-10-25 LAB — SURGICAL PATHOLOGY

## 2016-10-30 ENCOUNTER — Encounter: Payer: Self-pay | Admitting: *Deleted

## 2016-10-30 NOTE — Progress Notes (Signed)
  Oncology Nurse Navigator Documentation  Navigator Location: [facility] (10/30/16 1100)   )Navigator Encounter Type: Introductory phone call (10/30/16 1100)   Abnormal Finding Date: 10/11/16 (10/30/16 1100) Confirmed Diagnosis Date: 10/23/16 (10/30/16 1100)                   Barriers/Navigation Needs: Education (10/30/16 1100) Education: Coping with Diagnosis/ Prognosis;Newly Diagnosed Cancer Education (10/30/16 1100) Interventions: Education (10/30/16 1100)     Education Method: Written (10/30/16 1100)     Talked to patient today to establish navigation services.  Patient is newly diagnosed with ER/PR positive Her2 negative invasive breast cancer.  She states she does not have a date for her surgery yet.  Requested she let me know her surgery date.  Will [employer] patient breast cancer educational literature, "My Breast Cancer Treatment Handbook" by Josephine Igo, RN.  She is to call if she has any questions or needs.           Time Spent with Patient: 45 (10/30/16 1100)

## 2016-11-01 ENCOUNTER — Other Ambulatory Visit: Payer: Self-pay | Admitting: Surgery

## 2016-11-01 DIAGNOSIS — C50511 Malignant neoplasm of lower-outer quadrant of right female breast: Secondary | ICD-10-CM

## 2016-11-02 ENCOUNTER — Other Ambulatory Visit: Payer: Self-pay | Admitting: Surgery

## 2016-11-02 DIAGNOSIS — C50511 Malignant neoplasm of lower-outer quadrant of right female breast: Secondary | ICD-10-CM

## 2016-11-11 DIAGNOSIS — C50511 Malignant neoplasm of lower-outer quadrant of right female breast: Secondary | ICD-10-CM | POA: Insufficient documentation

## 2016-11-11 NOTE — Progress Notes (Signed)
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  INTERVAL HISTORY: Patient is a 82 year old female who was noted to have an abnormality on routine screening mammogram. Subsequent ultrasound and biopsy revealed the above stated breast cancer. Currently, she feels well and is asymptomatic. She has no neurologic complaints. She denies any recent fevers or illnesses. She has a good appetite and denies weight loss. She has no chest pain or shortness of breath. She denies any nausea, vomiting, constipation, or diarrhea. She has no urinary complaints. Patient feels at her baseline and offers no specific complaints today.    REVIEW OF SYSTEMS:   Review of Systems  Constitutional: Negative.  Negative for fever, malaise/fatigue and weight loss.  Respiratory: Negative.  Negative for shortness of breath.   Cardiovascular: Negative.  Negative for chest pain and leg swelling.  Gastrointestinal: Negative.  Negative for abdominal pain.  Genitourinary: Negative.   Musculoskeletal: Negative.   Skin: Negative.  Negative for rash.  Neurological: Negative.  Negative for weakness.  Psychiatric/Behavioral: Negative.  The patient is not nervous/anxious.     As per HPI. Otherwise, a complete review of systems is negative.  PAST MEDICAL HISTORY: Past Medical History:  Diagnosis Date  . Anemia   . Anxiety   . Arthritis    osteoarthritis  . Atrial fibrillation ([place])   . Cancer (HCC)    Breast  . Coronary artery disease   . Diabetes mellitus without complication ([place])   . Hyperlipidemia   . Hypertension   . Myocardial infarction ([place]) 08/05  . Osteoporosis     PAST SURGICAL HISTORY: Past Surgical  History:  Procedure Laterality Date  . ABDOMINAL HYSTERECTOMY    . APPENDECTOMY    . BLADDER SURGERY    . COLON SURGERY  10/99   hemi-colectomy  . MASS EXCISION Right 04/15/2015   Procedure: MINOR EXCISION OF MASS INCLUSION RIGHT NECK CYST;  Surgeon: Beverly Gust, MD;  Location: [facility];  Service: ENT;  Laterality: Right;  ** local only **    FAMILY HISTORY: Family History  Problem Relation Age of Onset  . Breast cancer Neg Hx     ADVANCED DIRECTIVES (Y/N):  N  HEALTH MAINTENANCE: Social History  Substance Use Topics  . Smoking status: Never Smoker  . Smokeless tobacco: Never Used  . Alcohol use No     Colonoscopy:  PAP:  Bone density:  Lipid panel:  No Known Allergies  Current Outpatient Prescriptions  Medication Sig Dispense Refill  . aspirin 81 MG tablet Take 81 mg by mouth daily.    . digoxin (LANOXIN) 0.125 MG tablet Take 0.125 mg by mouth daily. AM    . diltiazem (CARDIZEM) 120 MG tablet Take 120 mg by mouth daily. AM    . glimepiride (AMARYL) 2 MG tablet TAKE (1) TABLET BY MOUTH DAILY FOR DIABETES    . hydrochlorothiazide (HYDRODIURIL) 25 MG tablet Take 25 mg by mouth daily.     Marland Kitchen MAGNESIUM PO Take 250 mg by mouth daily. AM     . metFORMIN (GLUCOPHAGE) 500 MG tablet Take 500 mg by mouth 2 (two) times daily with a meal.     . simvastatin (ZOCOR) 40 MG tablet Take 40 mg by mouth daily.  PM    . sotalol (BETAPACE) 80 MG tablet Take 80 mg by mouth 2 (two) times daily. AM     . venlafaxine (EFFEXOR) 75 MG tablet Take 75 mg by mouth daily. AM    . warfarin (COUMADIN) 3 MG tablet Take 4.5 mg by mouth daily. PM    . cetirizine (ZYRTEC) 10 MG tablet Take 10 mg by mouth daily.    . metoprolol succinate (TOPROL-XL) 50 MG 24 hr tablet Take 50 mg by mouth daily. Take with or immediately following a meal.     No current facility-administered medications for this visit.     OBJECTIVE: Vitals:   11/13/16 1253  BP: 132/77  Pulse: 75  Temp: 97.5 F (36.4  C)     Body mass index is 21.86 kg/m.    ECOG FS:0 - Asymptomatic  General: Well-developed, well-nourished, no acute distress. Eyes: Pink conjunctiva, anicteric sclera. HEENT: Normocephalic, moist mucous membranes, clear oropharnyx. Breasts: Patient requested exam be deferred today. Lungs: Clear to auscultation bilaterally. Heart: Regular rate and rhythm. No rubs, murmurs, or gallops. Abdomen: Soft, nontender, nondistended. No organomegaly noted, normoactive bowel sounds. Musculoskeletal: No edema, cyanosis, or clubbing. Neuro: Alert, answering all questions appropriately. Cranial nerves grossly intact. Skin: No rashes or petechiae noted. Psych: Normal affect.   LAB RESULTS:  Lab Results  Component Value Date   NA 137 11/16/2016   K 4.1 11/16/2016   CL 103 11/16/2016   CO2 23 11/16/2016   GLUCOSE 178 (H) 11/16/2016   BUN 46 (H) 11/16/2016   CREATININE 1.95 (H) 11/16/2016   CALCIUM 9.3 11/16/2016   GFRNONAA 23 (L) 11/16/2016   GFRAA 27 (L) 11/16/2016    Lab Results  Component Value Date   WBC 8.9 11/16/2016   NEUTROABS 6.2 07/05/2013   HGB 11.2 (L) 11/16/2016   HCT 33.1 (L) 11/16/2016   MCV 87.7 11/16/2016   PLT 136 (L) 11/16/2016     STUDIES: Mm Clip Placement Right  Addendum Date: 10/24/2016   ADDENDUM REPORT: 10/24/2016 16:38 ADDENDUM: PATHOLOGY: Invasive mammary carcinoma, grade 1 CONCORDANT: YES I telephoned the patient on October 24, 2016 at 4:35 p.m. and discussed these results and the recommendations stated below. All questions were answered. The patient denies significant pain or bleeding from the biopsy site. Biopsy site care instructions were reviewed and the patient was asked to call the [facility] with any questions or issues related to the biopsy. RECOMMENDATION: Surgical consultation for management of this breast malignancy. Electronically Signed   By: Fidela Salisbury M.D.   On: 10/24/2016 16:38   Result Date: 10/24/2016 CLINICAL DATA:  Post biopsy  mammogram of the right breast for clip placement. EXAM: DIAGNOSTIC RIGHT MAMMOGRAM POST ULTRASOUND BIOPSY COMPARISON:  Previous exam(s). FINDINGS: Mammographic images were obtained following ultrasound guided biopsy of right breast mass 830. The coil shaped biopsy marking clip is appropriately positioned at the intended site of biopsy at 8:30. IMPRESSION: Appropriate positioning of the coil shaped biopsy marking clip at 8:30 in the right breast. Final Assessment: Post Procedure Mammograms for Marker Placement Electronically Signed: By: Ammie Ferrier M.D. On: 10/22/2016 16:01   [place] Rt Breast Bx W Loc Dev 1st Lesion Img Bx Spec US Guide  Result Date: 10/22/2016 CLINICAL DATA:  82 year old female presenting for ultrasound-guided biopsy of a right breast mass. EXAM: ULTRASOUND GUIDED RIGHT BREAST CORE NEEDLE BIOPSY COMPARISON:  Previous exam(s). FINDINGS: I met with the patient and we discussed the procedure of ultrasound-guided biopsy, including benefits and alternatives. We discussed  the high likelihood of a successful procedure. We discussed the risks of the procedure, including infection, bleeding, tissue injury, clip migration, and inadequate sampling. Informed written consent was given. The usual time-out protocol was performed immediately prior to the procedure. Lesion quadrant: Lower outer Using sterile technique and 1% Lidocaine as local anesthetic, under direct ultrasound visualization, a 14 gauge spring-loaded device was used to perform biopsy of a mass in the right breast at 8:30 using an inferior approach. At the conclusion of the procedure a coil shaped tissue marker clip was deployed into the biopsy cavity. Follow up 2 view mammogram was performed and dictated separately. IMPRESSION: Ultrasound guided biopsy of a right breast mass at 8:30. No apparent complications. Electronically Signed   By: Ammie Ferrier M.D.   On: 10/22/2016 16:02    ASSESSMENT: Clinical stage IA ER/PR positive, HER-2  negative invasive carcinoma of the lower outer quadrant of the right breast.  PLAN:   1. 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 Her surgery scheduled for Nov 23, 2016. Given the fact that the patient's tumor size is only 6 mm, do not plan on sending Oncotype testing at this time but will reconsider if her pathology changes after her lumpectomy. Patient will return to clinic at the end of May for further evaluation and discussion of her final pathology report. She will also have consultation with radiation oncology at that time for consideration of adjuvant XRT. When she completes her radiation, she will also benefit from an aromatase inhibitor for 5 years.  Approximately 60 minutes was spent in discussion of which greater than 50% was consultation.   Patient expressed understanding and was in agreement with this plan. She also understands that She can call clinic at any time with any questions, concerns, or complaints.   Cancer Staging Primary cancer of lower-outer quadrant of right breast [facility]) Staging form: Breast, AJCC 8th Edition - Clinical stage from 11/11/2016: Stage IA (cT1a, cN0, cM0, G1, ER: Positive, PR: Positive, HER2: Negative) - Signed by Lloyd Huger, MD on 11/11/2016   Lloyd Huger, MD   11/19/2016 11:03 AM

## 2016-11-13 ENCOUNTER — Encounter: Payer: Self-pay | Admitting: Oncology

## 2016-11-13 ENCOUNTER — Inpatient Hospital Stay: Payer: Medicare Other | Attending: Oncology | Admitting: Oncology

## 2016-11-13 ENCOUNTER — Encounter: Payer: Self-pay | Admitting: *Deleted

## 2016-11-13 DIAGNOSIS — I2510 Atherosclerotic heart disease of native coronary artery without angina pectoris: Secondary | ICD-10-CM

## 2016-11-13 DIAGNOSIS — M129 Arthropathy, unspecified: Secondary | ICD-10-CM

## 2016-11-13 DIAGNOSIS — I4891 Unspecified atrial fibrillation: Secondary | ICD-10-CM | POA: Diagnosis not present

## 2016-11-13 DIAGNOSIS — E119 Type 2 diabetes mellitus without complications: Secondary | ICD-10-CM

## 2016-11-13 DIAGNOSIS — M810 Age-related osteoporosis without current pathological fracture: Secondary | ICD-10-CM | POA: Diagnosis not present

## 2016-11-13 DIAGNOSIS — Z7901 Long term (current) use of anticoagulants: Secondary | ICD-10-CM | POA: Diagnosis not present

## 2016-11-13 DIAGNOSIS — E785 Hyperlipidemia, unspecified: Secondary | ICD-10-CM | POA: Diagnosis not present

## 2016-11-13 DIAGNOSIS — Z7984 Long term (current) use of oral hypoglycemic drugs: Secondary | ICD-10-CM | POA: Diagnosis not present

## 2016-11-13 DIAGNOSIS — C50511 Malignant neoplasm of lower-outer quadrant of right female breast: Secondary | ICD-10-CM

## 2016-11-13 DIAGNOSIS — Z170 Estrogen receptor positive status [ER+]: Secondary | ICD-10-CM | POA: Diagnosis not present

## 2016-11-13 DIAGNOSIS — D649 Anemia, unspecified: Secondary | ICD-10-CM

## 2016-11-13 DIAGNOSIS — Z7982 Long term (current) use of aspirin: Secondary | ICD-10-CM | POA: Diagnosis not present

## 2016-11-13 DIAGNOSIS — Z79899 Other long term (current) drug therapy: Secondary | ICD-10-CM | POA: Diagnosis not present

## 2016-11-13 DIAGNOSIS — I252 Old myocardial infarction: Secondary | ICD-10-CM | POA: Diagnosis not present

## 2016-11-13 DIAGNOSIS — I10 Essential (primary) hypertension: Secondary | ICD-10-CM | POA: Diagnosis not present

## 2016-11-13 DIAGNOSIS — F419 Anxiety disorder, unspecified: Secondary | ICD-10-CM | POA: Diagnosis not present

## 2016-11-13 NOTE — Progress Notes (Signed)
  Oncology Nurse Navigator Documentation  Navigator Location: [facility] (11/13/16 1200)   )Navigator Encounter Type: Initial MedOnc (11/13/16 1200)       Surgery Date: 11/23/16 (11/13/16 1200)             Patient Visit Type: MedOnc (11/13/16 1200) Treatment Phase: Pre-Tx/Tx Discussion (11/13/16 1200) Barriers/Navigation Needs: Education (11/13/16 1200) Education: Understanding Cancer/ Treatment Options (11/13/16 1200)    Met with patient and her daughter today during her initial medical oncology consult with Dr. Grayland Ormond.  Patient is scheduled for lumpectomy on 11/23/16.  Final treatment plan will be given after surgery.  At this time, probable radiation and antihormonal therapy only.  She is to call with any questions or needs.                    Time Spent with Patient: 90 (11/13/16 1200)

## 2016-11-16 ENCOUNTER — Ambulatory Visit: Payer: Medicare Other

## 2016-11-16 ENCOUNTER — Encounter
Admission: RE | Admit: 2016-11-16 | Discharge: 2016-11-16 | Disposition: A | Discharge status: Home / Self Care | Payer: Medicare Other | Source: Ambulatory Visit | Attending: Surgery | Admitting: Surgery

## 2016-11-16 DIAGNOSIS — Z01812 Encounter for preprocedural laboratory examination: Secondary | ICD-10-CM | POA: Diagnosis not present

## 2016-11-16 HISTORY — DX: Atherosclerotic heart disease of native coronary artery without angina pectoris: I25.10

## 2016-11-16 HISTORY — DX: Malignant (primary) neoplasm, unspecified: C80.1

## 2016-11-16 HISTORY — DX: Anxiety disorder, unspecified: F41.9

## 2016-11-16 LAB — BASIC METABOLIC PANEL
Anion gap: 11 (ref 5–15)
BUN: 46 mg/dL — AB (ref 6–20)
CHLORIDE: 103 mmol/L (ref 101–111)
CO2: 23 mmol/L (ref 22–32)
Calcium: 9.3 mg/dL (ref 8.9–10.3)
Creatinine, Ser: 1.95 mg/dL — ABNORMAL HIGH (ref 0.44–1.00)
GFR calc Af Amer: 27 mL/min — ABNORMAL LOW (ref >60)
GFR calc non Af Amer: 23 mL/min — ABNORMAL LOW (ref >60)
Glucose, Bld: 178 mg/dL — ABNORMAL HIGH (ref 65–99)
POTASSIUM: 4.1 mmol/L (ref 3.5–5.1)
SODIUM: 137 mmol/L (ref 135–145)

## 2016-11-16 LAB — CBC
HCT: 33.1 % — ABNORMAL LOW (ref 35.0–47.0)
HEMOGLOBIN: 11.2 g/dL — AB (ref 12.0–16.0)
MCH: 29.7 pg (ref 26.0–34.0)
MCHC: 33.8 g/dL (ref 32.0–36.0)
MCV: 87.7 fL (ref 80.0–100.0)
Platelets: 136 10*3/uL — ABNORMAL LOW (ref 150–440)
RBC: 3.77 MIL/uL — AB (ref 3.80–5.20)
RDW: 14 % (ref 11.5–14.5)
WBC: 8.9 10*3/uL (ref 3.6–11.0)

## 2016-11-16 NOTE — Pre-Procedure Instructions (Signed)
Surgical clearance on chart from Dr. Dossie Arbour patient is at low risk for surgery.

## 2016-11-16 NOTE — Pre-Procedure Instructions (Signed)
Component Name Value Ref Range  Vent Rate (bpm) 79   QRS Interval (msec) 86   QT Interval (msec) 344   QTc (msec) 394   Result Narrative  Atrial fibrillation Cannot rule out Anterior infarct , age undetermined ST and T wave abnormality, consider inferolateral ischemia Abnormal ECG When compared with ECG of 11-May-2004 10:49, PREVIOUS ECG IS PRESENT I reviewed and concur with this report. Electronically signed [id number] MD, [place] ([id number]) on 11/02/2016 7:30:23 AM  Status Results Details    Office Visit on 10/29/2016 [place]")' href="[email or web address]/">Encounter Summary

## 2016-11-16 NOTE — Patient Instructions (Signed)
  Your procedure is scheduled on: Nov 23, 2016 (Friday) REPORT TO [place] ARRIVAL TIME 10:00 am    Remember: Instructions that are not followed completely may result in serious medical risk, up to and including death, or upon the discretion of your surgeon and anesthesiologist your surgery may need to be rescheduled.    _x___ 1. Do not eat food or drink liquids after midnight. No gum chewing or hard candies                             __x__ 2. No Alcohol for 24 hours before or after surgery.   __x__3. No Smoking for 24 prior to surgery.   ____  4. Bring all medications with you on the day of surgery if instructed.    __x__ 5. Notify your doctor if there is any change in your medical condition     (cold, fever, infections).     Do not wear jewelry, make-up, hairpins, clips or nail polish.  Do not wear lotions, powders, or perfumes.   Do not shave 48 hours prior to surgery. Men may shave face and neck.  Do not bring valuables to the hospital.    [facility] is not responsible for any belongings or valuables.               Contacts, dentures or bridgework may not be worn into surgery.  Leave your suitcase in the car. After surgery it may be brought to your room.  For patients admitted to the hospital, discharge time is determined by your  treatment team                     Patients discharged the day of surgery will not be allowed to drive home.  You will need someone to drive you home and stay with you the night of your procedure.    Please read over the following fact sheets that you were given:   [facility] Preparing for Surgery and or MRSA Information   _x___ TAKE THE FOLLOWING MEDICATIONS WITH A SIP OF WATER THE MORNING OF SURGERY :  1. DIGOXIN  2. DILTIAZEM  3. METOPROLOL  4. SOTALOL  5. VENLAFAXINE  6.  ____Fleets enema or Magnesium Citrate as directed.   _x___ Use CHG Soap or sage wipes as directed on instruction sheet   ____ Use inhalers on the day  of surgery and bring to hospital day of surgery  __x__ Stop Metformin and Janumet 2 days prior to surgery. (STOP METFORMIN ON MAY 16 )   ____ Take 1/2 of usual insulin dose the night before surgery and none on the morning     surgery.   _x___ Follow recommendations from Cardiologist, Pulmonologist or PCP regarding          stopping Aspirin, Coumadin, Pllavix ,Eliquis, Effient, or Pradaxa, and Pletal. (STOP COUMADIN ON MAY 13, AND STOP ASPIRIN ONE WEEK BEFORE SURGERY PER DR MILLER INSTRUCTIONS)   X____Stop Anti-inflammatories such as Advil, Aleve, Ibuprofen, Motrin, Naproxen, Naprosyn, Goodies powders or aspirin products. OK to take Tylenol    _x___ Stop supplements until after surgery.  But may continue Vitamin D, Vitamin B, and multivitamin.     .   ____ Bring C-Pap to the hospital.

## 2016-11-19 NOTE — Pre-Procedure Instructions (Signed)
[facility] CALLED AND FAXED TO [place] AT DR SMITH'S. SHE WILL NOTIFY DR MILLER'S AND THEY WERE FAXED TO HIS OFFICE

## 2016-11-20 NOTE — Pre-Procedure Instructions (Signed)
DR Loleta Chance STOPPED HCTZ 5/14/18AND INSTRUCTED HYDRATION. RECHECKING MET B TODAY

## 2016-11-22 ENCOUNTER — Ambulatory Visit
Admission: RE | Admit: 2016-11-22 | Discharge: 2016-11-22 | Disposition: A | Discharge status: Home / Self Care | Payer: Medicare Other | Source: Ambulatory Visit | Attending: Internal Medicine | Admitting: Internal Medicine

## 2016-11-22 ENCOUNTER — Other Ambulatory Visit: Payer: Self-pay | Admitting: Internal Medicine

## 2016-11-22 DIAGNOSIS — N184 Chronic kidney disease, stage 4 (severe): Secondary | ICD-10-CM

## 2016-11-22 DIAGNOSIS — N281 Cyst of kidney, acquired: Secondary | ICD-10-CM | POA: Insufficient documentation

## 2016-11-23 ENCOUNTER — Ambulatory Visit: Payer: Medicare Other

## 2016-11-23 ENCOUNTER — Encounter: Admission: RE | Payer: Self-pay | Source: Ambulatory Visit

## 2016-11-23 ENCOUNTER — Ambulatory Visit: Admission: RE | Admit: 2016-11-23 | Payer: Medicare Other | Source: Ambulatory Visit | Admitting: Surgery

## 2016-11-23 SURGERY — PARTIAL MASTECTOMY WITH NEEDLE LOCALIZATION
Anesthesia: Choice | Laterality: Right

## 2016-11-26 ENCOUNTER — Encounter: Payer: Self-pay | Admitting: *Deleted

## 2016-11-26 NOTE — Progress Notes (Signed)
  Oncology Nurse Navigator Documentation  Navigator Location: [facility] (11/26/16 1400)   )Navigator Encounter Type: Telephone (11/26/16 1400) Telephone: [facility] Call (11/26/16 1400)     Surgery Date: 12/13/16 (11/26/16 1400)                                            Time Spent with Patient: 30 (11/26/16 1400)   Called patient to follow after surgery.  Patient's surgery had been cancelled and is rescheduled for 12/13/16.  Informed [place] to reschedule her follow up appointments with dr. Grayland Ormond and Dr. Baruch Gouty.

## 2016-11-28 NOTE — Pre-Procedure Instructions (Signed)
CLEARED TO PROCEED WITH SURGERY 11/22/16 BY DR Loleta Chance

## 2016-11-30 ENCOUNTER — Encounter (INDEPENDENT_AMBULATORY_CARE_PROVIDER_SITE_OTHER): Payer: Self-pay | Admitting: Vascular Surgery

## 2016-11-30 ENCOUNTER — Ambulatory Visit (INDEPENDENT_AMBULATORY_CARE_PROVIDER_SITE_OTHER): Payer: Medicare Other | Admitting: Vascular Surgery

## 2016-11-30 VITALS — BP 143/92 | HR 61 | Resp 14 | Ht 60.0 in | Wt 116.0 lb

## 2016-11-30 DIAGNOSIS — N183 Chronic kidney disease, stage 3 unspecified: Secondary | ICD-10-CM | POA: Insufficient documentation

## 2016-11-30 DIAGNOSIS — E119 Type 2 diabetes mellitus without complications: Secondary | ICD-10-CM

## 2016-11-30 DIAGNOSIS — I10 Essential (primary) hypertension: Secondary | ICD-10-CM

## 2016-11-30 DIAGNOSIS — E782 Mixed hyperlipidemia: Secondary | ICD-10-CM

## 2016-11-30 DIAGNOSIS — N184 Chronic kidney disease, stage 4 (severe): Secondary | ICD-10-CM | POA: Diagnosis not present

## 2016-11-30 NOTE — Assessment & Plan Note (Signed)
lipid control important in reducing the progression of atherosclerotic disease. Continue statin therapy  

## 2016-11-30 NOTE — Assessment & Plan Note (Signed)
This has been treated as essential hypertension, but assessment for renal artery stenosis as a cause of secondary hypertension is certainly reasonable. This will be done at the patient's convenience.

## 2016-11-30 NOTE — Patient Instructions (Signed)
Renal Artery Stenosis Renal artery stenosis (RAS) is narrowing of the artery that carries blood to your kidneys. It can affect one or both kidneys. Your kidneys filter waste and extra fluid from your blood. You get rid of the waste and fluid when you urinate. Your kidneys also make an important chemical messenger (hormone) called renin. Renin helps regulate your blood pressure. The first sign of RAS may be high blood pressure. Over time, other symptoms can develop. What are the causes? Plaque buildup in your arteries (atherosclerosis) is the main cause of RAS. The plaques that cause this are made up of:  Fat.  Cholesterol.  Calcium.  Other substances. As these substances build up in your renal artery, this slows the blood supply to your kidneys. The lack of blood and oxygen causes the signs and symptoms of RAS. A much less common cause of RAS is a disease called fibromuscular dysplasia. This disease causes abnormal cell growth that narrows the renal artery. It is not related to atherosclerosis. It occurs mostly in women who are 89-59 years old. It may be passed down through families. What increases the risk? You may be at risk for renal artery stenosis if you:  Are a man who is at least 80 years old.  Are a woman who is at least 80 years old.  Have high blood pressure.  Have high cholesterol.  Are a smoker.  Abuse alcohol.  Have diabetes or prediabetes.  Are overweight.  Have a family history of early heart disease. What are the signs or symptoms? RAS usually develops slowly. You may not have any signs or symptoms at first. The earliest signs may be:  Developing high blood pressure.  A sudden increase in existing high blood pressure.  No longer responding to medicine that used to control your blood pressure. Later signs and symptoms are due to kidney damage. They may include:  Fatigue.  Shortness of breath.  Swollen legs and feet.  Dry skin.  Headaches.  Muscle  cramps.  Loss of appetite.  Nausea or vomiting. How is this diagnosed? Your health care provider may suspect RAS based on changes in your blood pressure and your risk factors. A physical exam will be done. Your health care provider may use a stethoscope to listen for a whooshing sound (bruit) that can occur where the renal artery is blocking blood flow. Several tests may be done to confirm a diagnosis of RAS. These may include:  Blood and urine tests to check your kidney function.  Imaging tests of your kidneys, such as:  A test that involves using sound waves to create an image of your kidneys and the blood flow to your kidneys (ultrasound).  A test in which dye is injected into one of your blood vessels so images can be taken as the dye flows through your renal arteries (angiogram). These tests can be done using X-rays, a CT scan (computed tomography angiogram, CTA), or a type of MRI (magnetic resonance angiogram, MRA). How is this treated? Making lifestyle changes to reduce your risk factors is the first treatment option for early RAS. If the blood flow to one of your kidneys is cut by more than half, you may need medicine to:  Lower your blood pressure. This is the main medical treatment for RAS. You may need more than one type of medicine for this. The two types that work best for RAS are:  ACE inhibitors.  Angiotensin receptor blockers.  Reduce fluid in the body (diuretics).  Lower your cholesterol (statins). If medicine is not enough to control RAS, you may need surgery. This may involve:  Threading a tube with an inflatable balloon into the renal artery to force it open (angioplasty).  Removing plaque from inside the artery (endarterectomy). Follow these instructions at home:  Take medicines only as directed by your health care provider.  Make any lifestyle changes recommended by your health care provider. This may include:  Working with a dietitian to maintain a  heart-healthy diet. This type of diet is low in saturated fat, salt, and added sugar.  Starting an exercise program as directed by your health care provider.  Maintaining a healthy weight.  Quitting smoking.  Not abusing alcohol.  Keep all follow-up visits as directed by your health care provider. This is important. Contact a health care provider if:  Your symptoms of RAS are not getting better.  Your symptoms are changing or getting worse. Get help right away if:  You have very bad pain in your back or abdomen.  You have blood in your urine. This information is not intended to replace advice given to you by your health care provider. Make sure you discuss any questions you have with your health care provider. Document Released: 03/21/2005 Document Revised: 12/01/2015 Document Reviewed: 10/08/2013 Elsevier Interactive Patient Education  2017 [employer].

## 2016-11-30 NOTE — Progress Notes (Signed)
Patient ID: Pamela Hensley, female   DOB: [birth year], 82 y.o.   MRN: [id number]  Chief Complaint  Patient presents with  . New Evaluation    RAS    HPI Pamela Hensley is a 82 y.o. female.  I am asked to see the patient by Dr. Sabra Heck for evaluation of renal artery stenosis.  The patient reports No specific complaints today. She reports some episodes of mildly elevated blood pressure but no severe, malignant hypertension that she knows of. Her biggest issue has been with worsening renal dysfunction over the past several months. Her baseline creatinine is about 1.1. Her 2 most recent checks with 1.8 and 1.9.  she reports no difficulty with urination, decreased urination, hematuria, or pain with urination. She has not had any previous evaluation of her renal arteries, but she did have a plain renal ultrasound recently which I have independently reviewed. Her kidneys were slightly small at only around 10 cm and suggestion of medical renal disease was made. This did not assess her renal artery flow.   Past Medical History:  Diagnosis Date  . Anemia   . Anxiety   . Arthritis    osteoarthritis  . Atrial fibrillation ([place])   . Cancer (HCC)    Breast  . Coronary artery disease   . Diabetes mellitus without complication ([place])   . Hyperlipidemia   . Hypertension   . Myocardial infarction ([place]) 08/05  . Osteoporosis     Past Surgical History:  Procedure Laterality Date  . ABDOMINAL HYSTERECTOMY    . APPENDECTOMY    . BLADDER SURGERY    . COLON SURGERY  10/99   hemi-colectomy  . MASS EXCISION Right 04/15/2015   Procedure: MINOR EXCISION OF MASS INCLUSION RIGHT NECK CYST;  Surgeon: Beverly Gust, MD;  Location: [facility];  Service: ENT;  Laterality: Right;  ** local only **    Family History  Problem Relation Age of Onset  . Breast cancer Neg Hx   No bleeding disorders, clotting disorders, aneurysms, or porphyrias  Social History Social History  Substance Use Topics  .  Smoking status: Never Smoker  . Smokeless tobacco: Never Used  . Alcohol use No  No IVDU  No Known Allergies  Current Outpatient Prescriptions  Medication Sig Dispense Refill  . alendronate (FOSAMAX) 70 MG tablet Take 70 mg by mouth once a week. Take with a full glass of water on an empty stomach.    Marland Kitchen aspirin 81 MG tablet Take 81 mg by mouth daily.    . Calcium Carbonate-Vit D-Min (CALCIUM 1200 PO) Take by mouth.    . cetirizine (ZYRTEC) 10 MG tablet Take 10 mg by mouth daily.    . cholecalciferol (VITAMIN D) 400 units TABS tablet Take 400 Units by mouth.    . digoxin (LANOXIN) 0.125 MG tablet Take 0.125 mg by mouth daily. AM    . diltiazem (CARDIZEM) 120 MG tablet Take 120 mg by mouth daily. AM    . glimepiride (AMARYL) 2 MG tablet TAKE (1) TABLET BY MOUTH DAILY FOR DIABETES    . hydrochlorothiazide (HYDRODIURIL) 25 MG tablet Take 25 mg by mouth daily.     Marland Kitchen MAGNESIUM PO Take 250 mg by mouth daily. AM     . metFORMIN (GLUCOPHAGE) 500 MG tablet Take 500 mg by mouth 2 (two) times daily with a meal.     . metoprolol succinate (TOPROL-XL) 50 MG 24 hr tablet Take 50 mg by mouth daily. Take with  or immediately following a meal.    . simvastatin (ZOCOR) 40 MG tablet Take 40 mg by mouth daily. PM    . sotalol (BETAPACE) 80 MG tablet Take 80 mg by mouth 2 (two) times daily. AM     . venlafaxine (EFFEXOR) 75 MG tablet Take 75 mg by mouth daily. AM    . warfarin (COUMADIN) 3 MG tablet Take 4.5 mg by mouth daily. PM     No current facility-administered medications for this visit.       REVIEW OF SYSTEMS (Negative unless checked)  Constitutional: [] Weight loss  [] Fever  [] Chills Cardiac: [] Chest pain   [] Chest pressure   [] Palpitations   [] Shortness of breath when laying flat   [] Shortness of breath at rest   [] Shortness of breath with exertion. Vascular:  [] Pain in legs with walking   [] Pain in legs at rest   [] Pain in legs when laying flat   [] Claudication   [] Pain in feet when walking   [] Pain in feet at rest  [] Pain in feet when laying flat   [] History of DVT   [] Phlebitis   [] Swelling in legs   [] Varicose veins   [] Non-healing ulcers Pulmonary:   [] Uses home oxygen   [] Productive cough   [] Hemoptysis   [] Wheeze  [] COPD   [] Asthma Neurologic:  [] Dizziness  [] Blackouts   [] Seizures   [] History of stroke   [] History of TIA  [] Aphasia   [] Temporary blindness   [] Dysphagia   [] Weakness or numbness in arms   [] Weakness or numbness in legs Musculoskeletal:  [x] Arthritis   [] Joint swelling   [] Joint pain   [] Low back pain Hematologic:  [] Easy bruising  [] Easy bleeding   [] Hypercoagulable state   [] Anemic  [] Hepatitis Gastrointestinal:  [] Blood in stool   [] Vomiting blood  [] Gastroesophageal reflux/heartburn   [] Abdominal pain Genitourinary:  [] Chronic kidney disease   [] Difficult urination  [] Frequent urination  [] Burning with urination   [] Hematuria Skin:  [] Rashes   [] Ulcers   [] Wounds Psychological:  [] History of anxiety   []  History of major depression.    Physical Exam BP (!) 143/92 (BP Location: Right Arm)   Pulse 61   Resp 14   Ht 5' (1.524 m)   Wt 116 lb (52.6 kg)   BMI 22.65 kg/m  Gen:  WD/WN, NAD. Appears younger than stated age. Head: [place]/AT, No temporalis wasting.  Ear/Nose/Throat: Hearing grossly intact, nares w/o erythema or drainage, oropharynx w/o Erythema/Exudate Eyes: Conjunctiva clear, sclera non-icteric  Neck: trachea midline.  No bruit or JVD.  Pulmonary:  Good air movement, clear to auscultation bilaterally.  Cardiac: Irregularly irregular Vascular:  Vessel Right Left  Radial Palpable Palpable  Ulnar Palpable Palpable  Brachial Palpable Palpable  Carotid Palpable, without bruit Palpable, without bruit  Aorta Not palpable N/A  Femoral Palpable Palpable  Popliteal Palpable Palpable  PT Palpable Palpable  DP Palpable Not Palpable   Gastrointestinal: soft, non-tender/non-distended. No bruits Musculoskeletal: M/S 5/5 throughout.  Extremities without  ischemic changes.  No deformity or atrophy.  Neurologic: Sensation grossly intact in extremities.  Symmetrical.  Speech is fluent. Motor exam as listed above. Psychiatric: Judgment intact, Mood & affect appropriate for pt's clinical situation. Dermatologic: No rashes or ulcers noted.  No cellulitis or open wounds.  Radiology US Renal  Result Date: 11/22/2016 CLINICAL DATA:  Progressive renal dysfunction EXAM: RENAL / URINARY TRACT ULTRASOUND COMPLETE COMPARISON:  None. FINDINGS: Right Kidney: Length: 9.8 cm. Increased renal cortical echogenicity. 12 mm anechoic right lower pole renal mass most  consistent with a cyst. No solid mass or hydronephrosis visualized. Left Kidney: Length: 10 cm. Increased renal cortical echogenicity. Anechoic 12 mm left interpolar renal mass most consistent with a cyst. 10 mm anechoic left renal mass in the lower pole most consistent with a cyst. No solid mass or hydronephrosis visualized. Bladder: Appears normal for degree of bladder distention. IMPRESSION: 1. Increased renal cortical echogenicity as can be seen with medical renal disease. 2. No obstructive uropathy. 3. Bilateral renal cysts. Electronically Signed   By: Kathreen Devoid   On: 11/22/2016 11:08    Labs   Assessment/Plan:  Benign essential hypertension This has been treated as essential hypertension, but assessment for renal artery stenosis as a cause of secondary hypertension is certainly reasonable. This will be done at the patient's convenience.  Controlled type 2 diabetes mellitus without complication, without long-term current use of insulin (HCC) blood glucose control important in reducing the progression of atherosclerotic disease. Also, involved in wound healing. On appropriate medications.   Hyperlipidemia, mixed lipid control important in reducing the progression of atherosclerotic disease. Continue statin therapy   Chronic kidney disease (CKD), stage IV (severe) ([place]) She has chronic kidney  disease that is not entirely clear in its etiology. Given her multiple atherosclerotic risk factors and long-standing history of hypertension, I think assessment for renal artery stenosis is certainly reasonable. This will be done at the patient's convenience which she prefers to do this after her upcoming breast surgery which is reasonable. I will see her back at that time with renal artery duplex to discuss the results and determine further treatment options.      Leotis Pain 11/30/2016, 11:43 AM   This note was created with Dragon medical transcription system.  Any errors from dictation are unintentional.

## 2016-11-30 NOTE — Assessment & Plan Note (Signed)
blood glucose control important in reducing the progression of atherosclerotic disease. Also, involved in wound healing. On appropriate medications.  

## 2016-11-30 NOTE — Assessment & Plan Note (Signed)
She has chronic kidney disease that is not entirely clear in its etiology. Given her multiple atherosclerotic risk factors and long-standing history of hypertension, I think assessment for renal artery stenosis is certainly reasonable. This will be done at the patient's convenience which she prefers to do this after her upcoming breast surgery which is reasonable. I will see her back at that time with renal artery duplex to discuss the results and determine further treatment options.

## 2016-12-04 ENCOUNTER — Ambulatory Visit: Payer: Medicare Other | Admitting: Radiation Oncology

## 2016-12-04 ENCOUNTER — Inpatient Hospital Stay: Payer: Medicare Other | Admitting: Oncology

## 2016-12-12 ENCOUNTER — Encounter
Admission: RE | Admit: 2016-12-12 | Discharge: 2016-12-12 | Disposition: A | Discharge status: Home / Self Care | Payer: Medicare Other | Source: Ambulatory Visit | Attending: Surgery | Admitting: Surgery

## 2016-12-12 ENCOUNTER — Other Ambulatory Visit: Payer: Medicare Other

## 2016-12-12 NOTE — Patient Instructions (Signed)
  Your procedure is scheduled on: 12-13-16 Report to [facility] @ 7:45 AM  Remember: Instructions that are not followed completely may result in serious medical risk, up to and including death, or upon the discretion of your surgeon and anesthesiologist your surgery may need to be rescheduled.    _x___ 1. Do not eat food or drink liquids after midnight. No gum chewing or  hard candies.     __x__ 2. No Alcohol for 24 hours before or after surgery.   __x__3. No Smoking for 24 prior to surgery.   ____  4. Bring all medications with you on the day of surgery if instructed.    __x__ 5. Notify your doctor if there is any change in your medical condition     (cold, fever, infections).     Do not wear jewelry, make-up, hairpins, clips or nail polish.  Do not wear lotions, powders, or perfumes. You may wear deodorant.  Do not shave 48 hours prior to surgery. Men may shave face and neck.  Do not bring valuables to the hospital.    [facility] is not responsible for any belongings or valuables.               Contacts, dentures or bridgework may not be worn into surgery.  Leave your suitcase in the car. After surgery it may be brought to your room.  For patients admitted to the hospital, discharge time is determined by your                       treatment team.   Patients discharged the day of surgery will not be allowed to drive home.  You will need someone to drive you home and stay with you the night of your procedure.    Please read over the following fact sheets that you were given:   [facility] Preparing for Surgery and or MRSA Information   _x___ Take anti-hypertensive (unless it includes a diuretic), cardiac, seizure, asthma,     anti-reflux and psychiatric medicines. These include:  1. DIGOXIN  2. MAGNESIUM  3. METOPROLOL  4. SOTALOL  5. VENLAFAXINE  6. DILTIAZEM  ____Fleets enema or Magnesium Citrate as directed.   ____ Use CHG Soap or sage wipes as directed on  instruction sheet   ____ Use inhalers on the day of surgery and bring to hospital day of surgery  __X__ Stop Metformin and Janumet 2 days prior to surgery-STOP NOW-PT UNSURE IF LAST DOSE WAS 6-5 OR 6-4    ____ Take 1/2 of usual insulin dose the night before surgery and none on the morning     surgery.   _x___ Follow recommendations from Cardiologist, Pulmonologist or PCP regarding stopping Aspirin, Coumadin, Pllavix ,Eliquis, Effient, or Pradaxa, and Pletal-PT STATES SHE STOPPED ASA AND COUMADIN 12-08-16 AS INSTRUCTED  X____Stop Anti-inflammatories such as Advil, Aleve, Ibuprofen, Motrin, Naproxen, Naprosyn, Goodies powders or aspirin products NOW- OK to take Tylenol    _x___ Stop supplements until after surgery.  But may continue Vitamin D, Vitamin B,       and multivitamin.   ____ Bring C-Pap to the hospital.

## 2016-12-12 NOTE — Pre-Procedure Instructions (Signed)
ECG 12-lead4/23/2018 [place] Component Name Value Ref Range  Vent Rate (bpm) 79   QRS Interval (msec) 86   QT Interval (msec) 344   QTc (msec) 394   Result Narrative  Atrial fibrillation Cannot rule out Anterior infarct , age undetermined ST and T wave abnormality, consider inferolateral ischemia Abnormal ECG When compared with ECG of 11-May-2004 10:49, PREVIOUS ECG IS PRESENT I reviewed and concur with this report. Electronically signed XT:[id number] MD, [place] ([id number]) on 11/02/2016 7:30:23 AM  Status Results Details    Office Visit on 10/29/2016 [place]")' href="[email or web address]/">Encounter Summary

## 2016-12-13 ENCOUNTER — Other Ambulatory Visit: Payer: Self-pay | Admitting: Surgery

## 2016-12-13 ENCOUNTER — Ambulatory Visit
Admission: RE | Admit: 2016-12-13 | Discharge: 2016-12-13 | Disposition: A | Discharge status: Home / Self Care | Payer: Medicare Other | Source: Ambulatory Visit | Attending: Surgery | Admitting: Surgery

## 2016-12-13 ENCOUNTER — Encounter
Admission: RE | Disposition: A | Discharge status: Home / Self Care | Payer: Self-pay | Source: Ambulatory Visit | Attending: Surgery

## 2016-12-13 ENCOUNTER — Encounter: Payer: Self-pay | Admitting: *Deleted

## 2016-12-13 ENCOUNTER — Ambulatory Visit: Payer: Medicare Other | Admitting: Anesthesiology

## 2016-12-13 DIAGNOSIS — C50911 Malignant neoplasm of unspecified site of right female breast: Secondary | ICD-10-CM | POA: Diagnosis present

## 2016-12-13 DIAGNOSIS — Z7984 Long term (current) use of oral hypoglycemic drugs: Secondary | ICD-10-CM | POA: Insufficient documentation

## 2016-12-13 DIAGNOSIS — Z7982 Long term (current) use of aspirin: Secondary | ICD-10-CM | POA: Insufficient documentation

## 2016-12-13 DIAGNOSIS — F419 Anxiety disorder, unspecified: Secondary | ICD-10-CM | POA: Diagnosis not present

## 2016-12-13 DIAGNOSIS — I252 Old myocardial infarction: Secondary | ICD-10-CM | POA: Diagnosis not present

## 2016-12-13 DIAGNOSIS — C50511 Malignant neoplasm of lower-outer quadrant of right female breast: Secondary | ICD-10-CM

## 2016-12-13 DIAGNOSIS — I2510 Atherosclerotic heart disease of native coronary artery without angina pectoris: Secondary | ICD-10-CM | POA: Diagnosis not present

## 2016-12-13 DIAGNOSIS — I10 Essential (primary) hypertension: Secondary | ICD-10-CM | POA: Insufficient documentation

## 2016-12-13 DIAGNOSIS — E119 Type 2 diabetes mellitus without complications: Secondary | ICD-10-CM | POA: Insufficient documentation

## 2016-12-13 DIAGNOSIS — E785 Hyperlipidemia, unspecified: Secondary | ICD-10-CM | POA: Insufficient documentation

## 2016-12-13 DIAGNOSIS — Z9049 Acquired absence of other specified parts of digestive tract: Secondary | ICD-10-CM | POA: Insufficient documentation

## 2016-12-13 DIAGNOSIS — Z7901 Long term (current) use of anticoagulants: Secondary | ICD-10-CM | POA: Insufficient documentation

## 2016-12-13 HISTORY — PX: SENTINEL NODE BIOPSY: SHX6608

## 2016-12-13 HISTORY — PX: BREAST LUMPECTOMY: SHX2

## 2016-12-13 HISTORY — PX: PARTIAL MASTECTOMY WITH NEEDLE LOCALIZATION: SHX6008

## 2016-12-13 LAB — GLUCOSE, CAPILLARY
Glucose-Capillary: 128 mg/dL — ABNORMAL HIGH (ref 65–99)
Glucose-Capillary: 91 mg/dL (ref 65–99)

## 2016-12-13 LAB — PROTIME-INR
INR: 1.16
PROTHROMBIN TIME: 14.9 s (ref 11.4–15.2)

## 2016-12-13 SURGERY — PARTIAL MASTECTOMY WITH NEEDLE LOCALIZATION
Anesthesia: General | Laterality: Right

## 2016-12-13 MED ORDER — DEXAMETHASONE SODIUM PHOSPHATE 10 MG/ML IJ SOLN
INTRAMUSCULAR | Status: DC | PRN
  Administered 2016-12-13: 5 mg via INTRAVENOUS

## 2016-12-13 MED ORDER — DEXAMETHASONE SODIUM PHOSPHATE 10 MG/ML IJ SOLN
INTRAMUSCULAR | Status: AC
  Filled 2016-12-13: qty 1

## 2016-12-13 MED ORDER — ONDANSETRON HCL 4 MG/2ML IJ SOLN: 4.0000 mg | Freq: Once | INTRAMUSCULAR | Status: DC | PRN

## 2016-12-13 MED ORDER — EPHEDRINE SULFATE 50 MG/ML IJ SOLN
INTRAMUSCULAR | Status: AC
  Filled 2016-12-13: qty 1

## 2016-12-13 MED ORDER — FENTANYL CITRATE (PF) 100 MCG/2ML IJ SOLN: 25.0000 ug | INTRAMUSCULAR | Status: DC | PRN

## 2016-12-13 MED ORDER — PROPOFOL 10 MG/ML IV BOLUS
INTRAVENOUS | Status: AC
  Filled 2016-12-13: qty 20

## 2016-12-13 MED ORDER — PROPOFOL 10 MG/ML IV BOLUS
INTRAVENOUS | Status: DC | PRN
  Administered 2016-12-13: 120 mg via INTRAVENOUS

## 2016-12-13 MED ORDER — PHENYLEPHRINE HCL 10 MG/ML IJ SOLN
INTRAMUSCULAR | Status: DC | PRN
  Administered 2016-12-13 (×2): 50 ug via INTRAVENOUS

## 2016-12-13 MED ORDER — LIDOCAINE HCL (CARDIAC) 20 MG/ML IV SOLN
INTRAVENOUS | Status: DC | PRN
  Administered 2016-12-13: 60 mg via INTRAVENOUS

## 2016-12-13 MED ORDER — FENTANYL CITRATE (PF) 100 MCG/2ML IJ SOLN
INTRAMUSCULAR | Status: AC
  Filled 2016-12-13: qty 2

## 2016-12-13 MED ORDER — TECHNETIUM TC 99M SULFUR COLLOID FILTERED
0.6500 | Freq: Once | INTRAVENOUS | Status: AC | PRN
  Administered 2016-12-13: 0.65 via INTRADERMAL

## 2016-12-13 MED ORDER — ONDANSETRON HCL 4 MG/2ML IJ SOLN
INTRAMUSCULAR | Status: DC | PRN
  Administered 2016-12-13: 4 mg via INTRAVENOUS

## 2016-12-13 MED ORDER — ONDANSETRON HCL 4 MG/2ML IJ SOLN
INTRAMUSCULAR | Status: AC
  Filled 2016-12-13: qty 2

## 2016-12-13 MED ORDER — FAMOTIDINE 20 MG PO TABS
20.0000 mg | ORAL_TABLET | Freq: Once | ORAL | Status: AC
  Administered 2016-12-13: 20 mg via ORAL

## 2016-12-13 MED ORDER — BUPIVACAINE-EPINEPHRINE 0.5% -1:200000 IJ SOLN
INTRAMUSCULAR | Status: DC | PRN
  Administered 2016-12-13: 12 mL

## 2016-12-13 MED ORDER — HYDROCODONE-ACETAMINOPHEN 5-325 MG PO TABS: 1.0000 | ORAL_TABLET | ORAL | 0 refills | Status: DC | PRN

## 2016-12-13 MED ORDER — FAMOTIDINE 20 MG PO TABS
ORAL_TABLET | ORAL | Status: AC
  Administered 2016-12-13: 20 mg via ORAL
  Filled 2016-12-13: qty 1

## 2016-12-13 MED ORDER — EPHEDRINE SULFATE 50 MG/ML IJ SOLN
INTRAMUSCULAR | Status: DC | PRN
  Administered 2016-12-13 (×2): 10 mg via INTRAVENOUS

## 2016-12-13 MED ORDER — BUPIVACAINE-EPINEPHRINE (PF) 0.5% -1:200000 IJ SOLN
INTRAMUSCULAR | Status: AC
  Filled 2016-12-13: qty 30

## 2016-12-13 MED ORDER — HYDROCODONE-ACETAMINOPHEN 5-325 MG PO TABS: 1.0000 | ORAL_TABLET | ORAL | Status: DC | PRN

## 2016-12-13 MED ORDER — FENTANYL CITRATE (PF) 100 MCG/2ML IJ SOLN
INTRAMUSCULAR | Status: DC | PRN
  Administered 2016-12-13 (×2): 25 ug via INTRAVENOUS

## 2016-12-13 MED ORDER — SODIUM CHLORIDE 0.9 % IV SOLN
INTRAVENOUS | Status: DC
  Administered 2016-12-13: 10:00:00 via INTRAVENOUS

## 2016-12-13 MED ORDER — GLYCOPYRROLATE 0.2 MG/ML IJ SOLN
INTRAMUSCULAR | Status: DC | PRN
  Administered 2016-12-13: 0.2 mg via INTRAVENOUS

## 2016-12-13 SURGICAL SUPPLY — 33 items
BLADE SURG 15 STRL LF DISP TIS (BLADE) ×1 IMPLANT
BLADE SURG 15 STRL SS (BLADE) ×2
CANISTER SUCT 1200ML W/VALVE (MISCELLANEOUS) ×3 IMPLANT
CHLORAPREP W/TINT 26ML (MISCELLANEOUS) ×3 IMPLANT
CNTNR SPEC 2.5X3XGRAD LEK (MISCELLANEOUS) ×2
CONT SPEC 4OZ STER OR WHT (MISCELLANEOUS) ×4
CONTAINER SPEC 2.5X3XGRAD LEK (MISCELLANEOUS) ×2 IMPLANT
DERMABOND ADVANCED (GAUZE/BANDAGES/DRESSINGS) ×2
DERMABOND ADVANCED .7 DNX12 (GAUZE/BANDAGES/DRESSINGS) ×1 IMPLANT
DEVICE DUBIN SPECIMEN MAMMOGRA (MISCELLANEOUS) ×3 IMPLANT
DRAPE LAPAROTOMY 77X122 PED (DRAPES) ×3 IMPLANT
ELECT REM PT RETURN 9FT ADLT (ELECTROSURGICAL) ×3
ELECTRODE REM PT RTRN 9FT ADLT (ELECTROSURGICAL) ×1 IMPLANT
GLOVE BIO SURGEON STRL SZ7.5 (GLOVE) ×3 IMPLANT
GOWN STRL REUS W/ TWL LRG LVL3 (GOWN DISPOSABLE) ×2 IMPLANT
GOWN STRL REUS W/TWL LRG LVL3 (GOWN DISPOSABLE) ×4
KIT RM TURNOVER STRD PROC AR (KITS) ×3 IMPLANT
LABEL OR SOLS (LABEL) ×3 IMPLANT
MARGIN MAP 10MM (MISCELLANEOUS) ×3 IMPLANT
NDL SAFETY 18GX1.5 (NEEDLE) ×3 IMPLANT
NDL SAFETY 22GX1.5 (NEEDLE) ×3 IMPLANT
NEEDLE HYPO 25X1 1.5 SAFETY (NEEDLE) ×3 IMPLANT
PACK BASIN MINOR ARMC (MISCELLANEOUS) ×3 IMPLANT
SLEVE PROBE SENORX GAMMA FIND (MISCELLANEOUS) ×3 IMPLANT
SUT CHROMIC 3 0 SH 27 (SUTURE) IMPLANT
SUT CHROMIC 4 0 RB 1X27 (SUTURE) ×3 IMPLANT
SUT ETHILON 3-0 FS-10 30 BLK (SUTURE) ×3
SUT MNCRL 4-0 (SUTURE) ×2
SUT MNCRL 4-0 27XMFL (SUTURE) ×1
SUTURE EHLN 3-0 FS-10 30 BLK (SUTURE) ×1 IMPLANT
SUTURE MNCRL 4-0 27XMF (SUTURE) ×1 IMPLANT
SYRINGE 10CC LL (SYRINGE) ×3 IMPLANT
WATER STERILE IRR 1000ML POUR (IV SOLUTION) ×3 IMPLANT

## 2016-12-13 NOTE — Op Note (Signed)
OPERATIVE REPORT  PREOPERATIVE  DIAGNOSIS: . Right breast cancer  POSTOPERATIVE DIAGNOSIS: . Right breast cancer  PROCEDURE: . Right partial mastectomy, sentinel lymph node biopsy  ANESTHESIA:  General  SURGEON: Rochel Brome  MD   INDICATIONS: . She had recent findings of a small cancer of the lower outer quadrant of the right breast. Surgery was recommended for definitive treatment. She did have preoperative x-ray needle localization and injection for sentinel lymph node study.  With the patient on the operating table in the supine position she was placed under general anesthesia. The right arm was placed on a lateral arm support. The dressing was removed from the lower outer quadrant of the right breast exposing the Kopan's wire which was cut 2 cm from the skin. The breast was prepared with ChloraPrep and draped in a sterile manner.  An incision was made from the 7:00 to the 9:30 position of the right breast 6 cm from the nipple and carried down through subcutaneous tissues. Several small bleeding points were cauterized. The Kopan's wire was encountered at its tip. A portion of tissue surrounding a small palpable mass was excised so that a portion tissue was approximately 2 x 2 x 2 cm in dimension the specimen was labeled with margin maps using sutures to mark the medial lateral cranial caudal and deep margins.  Specimen mammogram demonstrated the biopsy marker. The pathologist called back to report that the surgical margins were satisfactory.  The right axilla was probed with a gamma counter demonstrating the location of radioactivity in the inferior aspect of the axilla. An oblique 3 cm incision was made and carried down through subcutaneous tissues. One traversing vessel was suture ligated with 4-0 chromic and divided. There were 2 sentinel lymph nodes found with radioactivity adjacent to one another. These were removed in one portion of tissue with some surrounding fatty tissue. The ex  vivo count per second was in the range of 800-900 for the 1 lymph node and approximately 200-300 for the other lymph node. These were submitted fresh for pathology.  The wounds were inspected and could see hemostasis was intact. Subcutaneous tissues were infiltrated with half percent Sensorcaine with epinephrine. Also tissues surrounding cautery artifact in the partial mastectomy wound were infiltrated as well.  The axillary wound was closed using 4-0 chromic to close the superficial fascia. The skin was closed with running 4-0 Monocryl subcuticular suture.  The breast wound was closed with 4-0 chromic subcutaneous sutures and 4-0 Monocryl subcuticular suture. Both wounds were treated with Dermabond and allowed to dry  The patient tolerated surgery satisfactorily and was prepared for transfer to the recovery room  [facility].D.

## 2016-12-13 NOTE — OR Nursing (Signed)
Dr. Tamala Julian in to see pt @1503 

## 2016-12-13 NOTE — H&P (Signed)
Pamela Hensley is an 82 y.o. female.   Chief Complaint: Breast cancer HPI: She had recent screening mammograms on 10/11/2016 demonstrating a 6 mm irregular mass in the lower outer quadrant of the right breast posterior depth with associated calcifications. The ultrasound demonstrated an irregular hypoechoic mass with surrounding hyper echogenicity measuring 5 x 5 x 6 mm at the 830 position 6 cm from the nipple targeted ultrasound of the right axilla demonstrated no suspicious appearing axillary lymph nodes. She had ultrasound-guided core needle biopsy on 10/22/2016 with findings of invasive mammary carcinoma no special type histologic grade 1 mass and calcifications together span approximately 9 mm.  Other than that above she reports no recent breast symptoms pain discharge or palpable mass. She has not had any breast problem in the past.       Past Medical History:  Diagnosis Date  . Anemia   . Anxiety   . Arthritis    osteoarthritis  . Atrial fibrillation ([place])   . Cancer (HCC)    Breast  . Coronary artery disease   . Diabetes mellitus without complication ([place])   . Hyperlipidemia   . Hypertension   . Myocardial infarction ([place]) 08/05  . Osteoporosis   She has had recent findings of elevated creatinine of 1.9. She is avoiding nonsteroidal anti-inflammatory agents. She is trying to stay hydrated.  Past Surgical History:  Procedure Laterality Date  . ABDOMINAL HYSTERECTOMY    . APPENDECTOMY    . BLADDER SURGERY    . COLON SURGERY  10/99   hemi-colectomy  . MASS EXCISION Right 04/15/2015   Procedure: MINOR EXCISION OF MASS INCLUSION RIGHT NECK CYST;  Surgeon: Beverly Gust, MD;  Location: [facility];  Service: ENT;  Laterality: Right;  ** local only **    Family History  Problem Relation Age of Onset  . Breast cancer Neg Hx    Social History:  reports that she has never smoked. She has never used smokeless tobacco. She reports that she does not drink alcohol or use  drugs.  Allergies: No Known Allergies  Medications Prior to Admission  Medication Sig Dispense Refill  . acetaminophen (TYLENOL) 325 MG tablet Take 325 mg by mouth every 6 (six) hours as needed for mild pain or headache.    Marland Kitchen aspirin 81 MG tablet Take 81 mg by mouth daily.    . cetirizine (ZYRTEC) 10 MG tablet Take 10 mg by mouth daily as needed for allergies.     . Cholecalciferol (VITAMIN D PO) Take 1 tablet by mouth daily.    . digoxin (LANOXIN) 0.125 MG tablet Take 0.125 mg by mouth daily. AM    . diltiazem (CARDIZEM) 120 MG tablet Take 120 mg by mouth daily. AM    . glimepiride (AMARYL) 2 MG tablet TAKE (1) TABLET BY MOUTH DAILY FOR DIABETES    . hydrochlorothiazide (HYDRODIURIL) 25 MG tablet Take 25 mg by mouth daily as needed (swelling).     Marland Kitchen MAGNESIUM PO Take 250 mg by mouth every morning.     . metFORMIN (GLUCOPHAGE) 500 MG tablet Take 500 mg by mouth 2 (two) times daily with a meal.     . metoprolol succinate (TOPROL-XL) 50 MG 24 hr tablet Take 50 mg by mouth every morning. Take with or immediately following a meal.     . simvastatin (ZOCOR) 40 MG tablet Take 40 mg by mouth daily at 6 PM. PM     . sotalol (BETAPACE) 80 MG tablet Take 80 mg  by mouth 2 (two) times daily. AM     . venlafaxine (EFFEXOR) 75 MG tablet Take 75 mg by mouth daily. AM    . warfarin (COUMADIN) 3 MG tablet Take 4.5 mg by mouth daily. PM      Results for orders placed or performed during the hospital encounter of 12/13/16 (from the past 48 hour(s))  Glucose, capillary     Status: Abnormal   Collection Time: 12/13/16 10:02 AM  Result Value Ref Range   Glucose-Capillary 128 (H) 65 - 99 mg/dL   Comment 1 Notify RN   Protime-INR     Status: None   Collection Time: 12/13/16 10:07 AM  Result Value Ref Range   Prothrombin Time 14.9 11.4 - 15.2 seconds   INR 1.16    US Guided Needle Placement  Result Date: 12/13/2016 CLINICAL DATA:  82 year old female with recently diagnosed invasive carcinoma of the right  breast at the 8:30 position 6 cm from the nipple. EXAM: NEEDLE LOCALIZATION OF THE RIGHT BREAST WITH ULTRASOUND GUIDANCE COMPARISON:  Previous exams. FINDINGS: Patient presents for needle localization prior to right breast lumpectomy. I met with the patient and we discussed the procedure of needle localization including benefits and alternatives. We discussed the high likelihood of a successful procedure. We discussed the risks of the procedure, including infection, bleeding, tissue injury, and further surgery. Informed, written consent was given. The usual time-out protocol was performed immediately prior to the procedure. Using ultrasound guidance, sterile technique, 2% lidocaine and a 7 cm modified Kopans needle, the mass in the right breast at the 8:30 position 6 cm from the nipple was localized using a lateral to medial approach. The images were marked for Dr. Tamala Julian. Note that the first attempt at localizing the mass was unsuccessful with the wire in the incorrect location, and therefore the wire was removed and the correct site with the mass and clip at the 8:30 position was successfully localized with the mass and clip located at the tip of the wire. IMPRESSION: Needle localization right breast. No apparent complications. Electronically Signed   By: Everlean Alstrom M.D.   On: 12/13/2016 09:34   Nm Sentinel Node Injection  Result Date: 12/13/2016 CLINICAL DATA:  Right breast cancer. EXAM: NUCLEAR MEDICINE BREAST LYMPHOSCINTIGRAPHY TECHNIQUE: Intradermal injection of radiopharmaceutical was performed at the 12 o'clock, 3 o'clock, 6 o'clock, and 9 o'clock positions around the right nipple. The patient was then sent to the operating room where the sentinel node(s) were identified and removed by the surgeon. RADIOPHARMACEUTICALS:  Total of 1 mCi Millipore-filtered Technetium-73msulfur colloid, injected in four aliquots of 0.25 mCi each. IMPRESSION: Uncomplicated intradermal injection of a total of 1 mCi  Technetium-963mulfur colloid for purposes of sentinel node identification. Electronically Signed   By: ArMarybelle Killings.D.   On: 12/13/2016 11:11   Mm Digital Diag Ltd R  Result Date: 12/13/2016 CLINICAL DATA:  [age]ear old female with recently diagnosed invasive right breast cancer post ultrasound-guided needle localization of the mass at the 8:30 position. EXAM: DIAGNOSTIC RIGHT MAMMOGRAM POST ULTRASOUND GUIDED NEEDLE LOCALIZATION COMPARISON:  Previous exam(s). FINDINGS: Mammographic images were obtained following ultrasound guided needle localization of a mass in the right breast at the 8:30 position 6 cm from nipple. The tip of the wire is positioned within the mass at the 8:30 position and adjacent to the coil shaped biopsy might marking clip. IMPRESSION: Wire tip positioned within mass and adjacent coil shaped biopsy marking clip. Final Assessment: Post Procedure Mammograms for Marker Placement  Electronically Signed   By: Everlean Alstrom M.D.   On: 12/13/2016 09:39    ROS she reports no change in condition since the office visit. She reports no difficulties breathing, no chest pains. No other recent acute illness.  Blood pressure (!) 175/67, pulse 71, temperature 97.5 F (36.4 C), temperature source Oral, resp. rate 16, height 5' (1.524 m), weight 116 lb (52.6 kg), SpO2 98 %. Physical Exam GEN.: She is awake alert and oriented.  HEENT:  Head is normocephalic.  Pupils are equal reactive to light.  Extraocular movements are intact. Sclera is clear.  Pharynx is clear.  NECK:  Supple with no palpable mass and no adenopathy.  LUNGS:  Clear without rales rhonchi or wheezes.  HEART:  Regular rhythm S1-S2, without murmur.  RIGHT BREAST: There is a dressing on the lower aspect of the right breast covering the Kopan's wire. The right side was marked YES  EXTREMITIES:  Well-developed well-nourished with no dependent edema.  NEUROLOGIC:  Awake alert and moving all  extremities.  Assessment/Plan Carcinoma the right breast  I discussed plan for right partial mastectomy with sentinel lymph node biopsy  Rochel Brome, MD 12/13/2016, 11:16 AM

## 2016-12-13 NOTE — Anesthesia Preprocedure Evaluation (Signed)
Anesthesia Evaluation  Patient identified by MRN, date of birth, ID band Patient awake    Reviewed: Allergy & Precautions, NPO status , Patient's Chart, lab work & pertinent test results  History of Anesthesia Complications Negative for: history of anesthetic complications  Airway Mallampati: II       Dental   Pulmonary neg pulmonary ROS,           Cardiovascular hypertension, Pt. on medications + CAD and + Past MI       Neuro/Psych Anxiety negative neurological ROS     GI/Hepatic negative GI ROS, Neg liver ROS,   Endo/Other  diabetes, Type 2, Oral Hypoglycemic Agents  Renal/GU Renal InsufficiencyRenal disease     Musculoskeletal   Abdominal   Peds  Hematology  (+) anemia ,   Anesthesia Other Findings   Reproductive/Obstetrics                            Anesthesia Physical Anesthesia Plan  ASA: III  Anesthesia Plan: General   Post-op Pain Management:    Induction: Intravenous  PONV Risk Score and Plan:   Airway Management Planned: LMA  Additional Equipment:   Intra-op Plan:   Post-operative Plan:   Informed Consent: I have reviewed the patients History and Physical, chart, labs and discussed the procedure including the risks, benefits and alternatives for the proposed anesthesia with the patient or authorized representative who has indicated his/her understanding and acceptance.     Plan Discussed with:   Anesthesia Plan Comments:         Anesthesia Quick Evaluation

## 2016-12-13 NOTE — Discharge Instructions (Addendum)
Take Tylenol or Norco if needed for pain. ° °Should not drive or do anything dangerous when taking Norco. ° °May shower and blot dry. ° °Wear bra as desired for comfort and support. ° °AMBULATORY SURGERY  °DISCHARGE INSTRUCTIONS ° ° °1) The drugs that you were given will stay in your system until tomorrow so for the next 24 hours you should not: ° °A) Drive an automobile °B) Make any legal decisions °C) Drink any alcoholic beverage ° ° °2) You may resume regular meals tomorrow.  Today it is better to start with liquids and gradually work up to solid foods. ° °You may eat anything you prefer, but it is better to start with liquids, then soup and crackers, and gradually work up to solid foods. ° ° °3) Please notify your doctor immediately if you have any unusual bleeding, trouble breathing, redness and pain at the surgery site, drainage, fever, or pain not relieved by medication. ° ° ° °4) Additional Instructions: ° ° ° ° ° ° ° °Please contact your physician with any problems or Same Day Surgery at [phone], Monday through Friday 6 am to 4 pm, or [place] [facility] Main number at [phone]. °

## 2016-12-13 NOTE — Anesthesia Procedure Notes (Signed)
Procedure Name: LMA Insertion Date/Time: 12/13/2016 11:31 AM Performed by: Dionne Bucy Pre-anesthesia Checklist: Patient identified, Patient being monitored, Timeout performed, Emergency Drugs available and Suction available Patient Re-evaluated:Patient Re-evaluated prior to inductionOxygen Delivery Method: Circle system utilized Preoxygenation: Pre-oxygenation with 100% oxygen Intubation Type: IV induction Ventilation: Mask ventilation without difficulty LMA: LMA inserted LMA Size: 4.0 Tube type: Oral Number of attempts: 1 Placement Confirmation: positive ETCO2 and breath sounds checked- equal and bilateral Tube secured with: Tape Dental Injury: Teeth and Oropharynx as per pre-operative assessment

## 2016-12-13 NOTE — Transfer of Care (Signed)
Immediate Anesthesia Transfer of Care Note  Patient: Pamela Hensley  Procedure(s) Performed: Procedure(s): PARTIAL MASTECTOMY WITH NEEDLE LOCALIZATION (Right) SENTINEL NODE BIOPSY (Right)  Patient Location: PACU  Anesthesia Type:General  Level of Consciousness: sedated  Airway & Oxygen Therapy: Patient Spontanous Breathing and Patient connected to face mask oxygen  Post-op Assessment: Report given to RN and Post -op Vital signs reviewed and stable  Post vital signs: Reviewed and stable  Last Vitals:  Vitals:   12/13/16 1013 12/13/16 1259  BP: (!) 175/67 (!) 146/88  Pulse:  76  Resp:  18  Temp:  36.3 C    Last Pain:  Vitals:   12/13/16 1008  TempSrc: Oral  PainSc: 0-No pain         Complications: No apparent anesthesia complications

## 2016-12-13 NOTE — Anesthesia Post-op Follow-up Note (Cosign Needed)
Anesthesia QCDR form completed.        

## 2016-12-14 LAB — SURGICAL PATHOLOGY

## 2016-12-14 NOTE — Anesthesia Postprocedure Evaluation (Signed)
Anesthesia Post Note  Patient: Pamela Hensley  Procedure(s) Performed: Procedure(s) (LRB): PARTIAL MASTECTOMY WITH NEEDLE LOCALIZATION (Right) SENTINEL NODE BIOPSY (Right)  Patient location during evaluation: PACU Anesthesia Type: General Level of consciousness: awake Pain management: pain level controlled Vital Signs Assessment: post-procedure vital signs reviewed and stable Respiratory status: nonlabored ventilation Cardiovascular status: stable Anesthetic complications: no     Last Vitals:  Vitals:   12/13/16 1406 12/13/16 1500  BP: (!) 164/97 (!) 177/91  Pulse: 75 73  Resp: 16 16  Temp: 36.2 C 36.3 C    Last Pain:  Vitals:   12/13/16 1500  TempSrc: Temporal  PainSc:                  VAN STAVEREN,Naasia Weilbacher

## 2016-12-21 ENCOUNTER — Ambulatory Visit: Payer: Self-pay

## 2016-12-26 NOTE — Progress Notes (Signed)
[place] [facility]  Telephone:(336) [phone] Fax:(336) [phone]  ID: Pamela Hensley OB: 22-Apr-1935  MR#: [id number]  WER#:[id number]  Patient Care Team: Rusty Aus, MD as PCP - General (Internal Medicine)  CHIEF COMPLAINT: Pathologic stage IA ER/PR positive, HER-2 negative invasive carcinoma of the lower outer quadrant of the right breast.  INTERVAL HISTORY: Patient returns to clinic today for further evaluation, discussion of her final pathology results, and treatment planning. She tolerated her lumpectomy well without significant side effects. She currently she feels well and is asymptomatic. She has no neurologic complaints. She denies any recent fevers or illnesses. She has a good appetite and denies weight loss. She has no chest pain or shortness of breath. She denies any nausea, vomiting, constipation, or diarrhea. She has no urinary complaints. Patient feels at her baseline and offers no specific complaints today.    REVIEW OF SYSTEMS:   Review of Systems  Constitutional: Negative.  Negative for fever, malaise/fatigue and weight loss.  Respiratory: Negative.  Negative for shortness of breath.   Cardiovascular: Negative.  Negative for chest pain and leg swelling.  Gastrointestinal: Negative.  Negative for abdominal pain.  Genitourinary: Negative.   Musculoskeletal: Negative.   Skin: Negative.  Negative for rash.  Neurological: Negative.  Negative for weakness.  Psychiatric/Behavioral: Negative.  The patient is not nervous/anxious.     As per HPI. Otherwise, a complete review of systems is negative.  PAST MEDICAL HISTORY: Past Medical History:  Diagnosis Date  . Anemia   . Anxiety   . Arthritis    osteoarthritis  . Atrial fibrillation ([place])   . Cancer (HCC)    Breast  . Coronary artery disease   . Diabetes mellitus without complication ([place])   . Hyperlipidemia   . Hypertension   . Myocardial infarction ([place]) 08/05  . Osteoporosis     PAST SURGICAL  HISTORY: Past Surgical History:  Procedure Laterality Date  . ABDOMINAL HYSTERECTOMY    . APPENDECTOMY    . BLADDER SURGERY    . COLON SURGERY  10/99   hemi-colectomy  . MASS EXCISION Right 04/15/2015   Procedure: MINOR EXCISION OF MASS INCLUSION RIGHT NECK CYST;  Surgeon: Beverly Gust, MD;  Location: [facility];  Service: ENT;  Laterality: Right;  ** local only **  . PARTIAL MASTECTOMY WITH NEEDLE LOCALIZATION Right 12/13/2016   Procedure: PARTIAL MASTECTOMY WITH NEEDLE LOCALIZATION;  Surgeon: Leonie Green, MD;  Location: [facility];  Service: General;  Laterality: Right;  . SENTINEL NODE BIOPSY Right 12/13/2016   Procedure: SENTINEL NODE BIOPSY;  Surgeon: Leonie Green, MD;  Location: [facility];  Service: General;  Laterality: Right;    FAMILY HISTORY: Family History  Problem Relation Age of Onset  . Breast cancer Neg Hx     ADVANCED DIRECTIVES (Y/N):  N  HEALTH MAINTENANCE: Social History  Substance Use Topics  . Smoking status: Never Smoker  . Smokeless tobacco: Never Used  . Alcohol use No     Colonoscopy:  PAP:  Bone density:  Lipid panel:  No Known Allergies  Current Outpatient Prescriptions  Medication Sig Dispense Refill  . acetaminophen (TYLENOL) 325 MG tablet Take 325 mg by mouth every 6 (six) hours as needed for mild pain or headache.    Marland Kitchen aspirin 81 MG tablet Take 81 mg by mouth daily.    . cetirizine (ZYRTEC) 10 MG tablet Take 10 mg by mouth daily as needed for allergies.     . Cholecalciferol (VITAMIN D PO)  Take 1 tablet by mouth daily.    . digoxin (LANOXIN) 0.125 MG tablet Take 0.125 mg by mouth daily. AM    . diltiazem (CARDIZEM) 120 MG tablet Take 120 mg by mouth daily. AM    . glimepiride (AMARYL) 2 MG tablet TAKE (1) TABLET BY MOUTH DAILY FOR DIABETES    . hydrochlorothiazide (HYDRODIURIL) 25 MG tablet Take 25 mg by mouth daily as needed (swelling).     Marland Kitchen MAGNESIUM PO Take 250 mg by mouth every morning.     . metFORMIN  (GLUCOPHAGE) 500 MG tablet Take 500 mg by mouth 2 (two) times daily with a meal.     . metoprolol succinate (TOPROL-XL) 50 MG 24 hr tablet Take 50 mg by mouth every morning. Take with or immediately following a meal.     . simvastatin (ZOCOR) 40 MG tablet Take 40 mg by mouth daily at 6 PM. PM     . sotalol (BETAPACE) 80 MG tablet Take 80 mg by mouth 2 (two) times daily. AM     . venlafaxine (EFFEXOR) 75 MG tablet Take 75 mg by mouth daily. AM    . warfarin (COUMADIN) 3 MG tablet Take 4.5 mg by mouth daily. PM     No current facility-administered medications for this visit.     OBJECTIVE: Vitals:   12/27/16 1348  BP: (!) 146/86  Pulse: 63  Resp: 16  Temp: 97.5 F (36.4 C)     Body mass index is 23.14 kg/m.    ECOG FS:0 - Asymptomatic  General: Well-developed, well-nourished, no acute distress. Eyes: Pink conjunctiva, anicteric sclera. HEENT: Normocephalic, moist mucous membranes, clear oropharnyx. Breasts: Well-healed surgical scar on right breast. Lungs: Clear to auscultation bilaterally. Heart: Regular rate and rhythm. No rubs, murmurs, or gallops. Abdomen: Soft, nontender, nondistended. No organomegaly noted, normoactive bowel sounds. Musculoskeletal: No edema, cyanosis, or clubbing. Neuro: Alert, answering all questions appropriately. Cranial nerves grossly intact. Skin: No rashes or petechiae noted. Psych: Normal affect.   LAB RESULTS:  Lab Results  Component Value Date   NA 137 11/16/2016   K 4.1 11/16/2016   CL 103 11/16/2016   CO2 23 11/16/2016   GLUCOSE 178 (H) 11/16/2016   BUN 46 (H) 11/16/2016   CREATININE 1.95 (H) 11/16/2016   CALCIUM 9.3 11/16/2016   GFRNONAA 23 (L) 11/16/2016   GFRAA 27 (L) 11/16/2016    Lab Results  Component Value Date   WBC 8.9 11/16/2016   NEUTROABS 6.2 07/05/2013   HGB 11.2 (L) 11/16/2016   HCT 33.1 (L) 11/16/2016   MCV 87.7 11/16/2016   PLT 136 (L) 11/16/2016     STUDIES: US Guided Needle Placement  Result Date:  12/13/2016 CLINICAL DATA:  82 year old female with recently diagnosed invasive carcinoma of the right breast at the 8:30 position 6 cm from the nipple. EXAM: NEEDLE LOCALIZATION OF THE RIGHT BREAST WITH ULTRASOUND GUIDANCE COMPARISON:  Previous exams. FINDINGS: Patient presents for needle localization prior to right breast lumpectomy. I met with the patient and we discussed the procedure of needle localization including benefits and alternatives. We discussed the high likelihood of a successful procedure. We discussed the risks of the procedure, including infection, bleeding, tissue injury, and further surgery. Informed, written consent was given. The usual time-out protocol was performed immediately prior to the procedure. Using ultrasound guidance, sterile technique, 2% lidocaine and a 7 cm modified Kopans needle, the mass in the right breast at the 8:30 position 6 cm from the nipple was localized using a  lateral to medial approach. The images were marked for Dr. Tamala Julian. Note that the first attempt at localizing the mass was unsuccessful with the wire in the incorrect location, and therefore the wire was removed and the correct site with the mass and clip at the 8:30 position was successfully localized with the mass and clip located at the tip of the wire. IMPRESSION: Needle localization right breast. No apparent complications. Electronically Signed   By: Everlean Alstrom M.D.   On: 12/13/2016 09:34   Nm Sentinel Node Injection  Result Date: 12/13/2016 CLINICAL DATA:  Right breast cancer. EXAM: NUCLEAR MEDICINE BREAST LYMPHOSCINTIGRAPHY TECHNIQUE: Intradermal injection of radiopharmaceutical was performed at the 12 o'clock, 3 o'clock, 6 o'clock, and 9 o'clock positions around the right nipple. The patient was then sent to the operating room where the sentinel node(s) were identified and removed by the surgeon. RADIOPHARMACEUTICALS:  Total of 1 mCi Millipore-filtered Technetium-41msulfur colloid, injected in four  aliquots of 0.25 mCi each. IMPRESSION: Uncomplicated intradermal injection of a total of 1 mCi Technetium-924mulfur colloid for purposes of sentinel node identification. Electronically Signed   By: ArMarybelle Killings.D.   On: 12/13/2016 11:11   Mm Breast Surgical Specimen  Result Date: 12/13/2016 CLINICAL DATA:  Post right breast lumpectomy. EXAM: SPECIMEN RADIOGRAPH OF THE RIGHT BREAST COMPARISON:  Previous exam(s). FINDINGS: Status post excision of the right breast. The wire tip and biopsy marker clip are present and are marked for pathology. IMPRESSION: Specimen radiograph of the right breast. Electronically Signed   By: JeEverlean Alstrom.D.   On: 12/13/2016 12:21   Mm Digital Diag Ltd R  Result Date: 12/13/2016 CLINICAL DATA:  [age]ear old female with recently diagnosed invasive right breast cancer post ultrasound-guided needle localization of the mass at the 8:30 position. EXAM: DIAGNOSTIC RIGHT MAMMOGRAM POST ULTRASOUND GUIDED NEEDLE LOCALIZATION COMPARISON:  Previous exam(s). FINDINGS: Mammographic images were obtained following ultrasound guided needle localization of a mass in the right breast at the 8:30 position 6 cm from nipple. The tip of the wire is positioned within the mass at the 8:30 position and adjacent to the coil shaped biopsy might marking clip. IMPRESSION: Wire tip positioned within mass and adjacent coil shaped biopsy marking clip. Final Assessment: Post Procedure Mammograms for Marker Placement Electronically Signed   By: JeEverlean Alstrom.D.   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  PLAN:   1. 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 Given the small size of patient's tumor, Oncotype testing is not necessary. Patient does not require adjuvant  chemotherapy. Referrals given to radiation oncology for further evaluation and consideration of adjuvant XRT. Patient will return to clinic in 2 months at the conclusion of XRT for further evaluation and initiation of an aromatase inhibitor.  2. Hypertension: Patient's blood pressure is mildly elevated today monitor.  Approximately 30 minutes was spent in discussion of which greater than 50% was consultation.   Patient expressed understanding and was in agreement with this plan. She also understands that She can call clinic at any time with any questions, concerns, or complaints.   Cancer Staging Primary cancer of lower-outer quadrant of right breast ([facility] LLCStaging form: Breast, AJCC 8th Edition - Clinical stage from 11/11/2016: Stage IA (cT1a, cN0, cM0, G1, ER: Positive, PR: Positive, HER2:  Negative) - Signed by Lloyd Huger, MD on 11/11/2016   Lloyd Huger, MD   12/30/2016 9:13 AM

## 2016-12-27 ENCOUNTER — Inpatient Hospital Stay: Payer: Medicare Other | Attending: Oncology | Admitting: Oncology

## 2016-12-27 VITALS — BP 146/86 | HR 63 | Temp 97.5°F | Resp 16 | Wt 118.5 lb

## 2016-12-27 DIAGNOSIS — Z79899 Other long term (current) drug therapy: Secondary | ICD-10-CM | POA: Diagnosis not present

## 2016-12-27 DIAGNOSIS — Z170 Estrogen receptor positive status [ER+]: Secondary | ICD-10-CM | POA: Diagnosis not present

## 2016-12-27 DIAGNOSIS — Z7901 Long term (current) use of anticoagulants: Secondary | ICD-10-CM | POA: Diagnosis not present

## 2016-12-27 DIAGNOSIS — M1990 Unspecified osteoarthritis, unspecified site: Secondary | ICD-10-CM | POA: Diagnosis not present

## 2016-12-27 DIAGNOSIS — I4891 Unspecified atrial fibrillation: Secondary | ICD-10-CM | POA: Diagnosis not present

## 2016-12-27 DIAGNOSIS — I10 Essential (primary) hypertension: Secondary | ICD-10-CM

## 2016-12-27 DIAGNOSIS — M810 Age-related osteoporosis without current pathological fracture: Secondary | ICD-10-CM | POA: Diagnosis not present

## 2016-12-27 DIAGNOSIS — E119 Type 2 diabetes mellitus without complications: Secondary | ICD-10-CM | POA: Insufficient documentation

## 2016-12-27 DIAGNOSIS — M129 Arthropathy, unspecified: Secondary | ICD-10-CM | POA: Diagnosis not present

## 2016-12-27 DIAGNOSIS — F419 Anxiety disorder, unspecified: Secondary | ICD-10-CM | POA: Diagnosis not present

## 2016-12-27 DIAGNOSIS — E785 Hyperlipidemia, unspecified: Secondary | ICD-10-CM | POA: Insufficient documentation

## 2016-12-27 DIAGNOSIS — I252 Old myocardial infarction: Secondary | ICD-10-CM | POA: Insufficient documentation

## 2016-12-27 DIAGNOSIS — Z7984 Long term (current) use of oral hypoglycemic drugs: Secondary | ICD-10-CM | POA: Insufficient documentation

## 2016-12-27 DIAGNOSIS — I2510 Atherosclerotic heart disease of native coronary artery without angina pectoris: Secondary | ICD-10-CM | POA: Diagnosis not present

## 2016-12-27 DIAGNOSIS — Z7982 Long term (current) use of aspirin: Secondary | ICD-10-CM | POA: Diagnosis not present

## 2016-12-27 DIAGNOSIS — C50511 Malignant neoplasm of lower-outer quadrant of right female breast: Secondary | ICD-10-CM | POA: Insufficient documentation

## 2016-12-31 ENCOUNTER — Encounter: Payer: Self-pay | Admitting: Radiation Oncology

## 2016-12-31 ENCOUNTER — Ambulatory Visit
Admission: RE | Admit: 2016-12-31 | Discharge: 2016-12-31 | Disposition: A | Discharge status: Home / Self Care | Payer: Medicare Other | Source: Ambulatory Visit | Attending: Radiation Oncology | Admitting: Radiation Oncology

## 2016-12-31 VITALS — BP 132/83 | HR 72 | Temp 96.6°F | Resp 18 | Wt 117.0 lb

## 2016-12-31 DIAGNOSIS — I2510 Atherosclerotic heart disease of native coronary artery without angina pectoris: Secondary | ICD-10-CM | POA: Insufficient documentation

## 2016-12-31 DIAGNOSIS — I4891 Unspecified atrial fibrillation: Secondary | ICD-10-CM | POA: Diagnosis not present

## 2016-12-31 DIAGNOSIS — Z170 Estrogen receptor positive status [ER+]: Secondary | ICD-10-CM | POA: Diagnosis not present

## 2016-12-31 DIAGNOSIS — I10 Essential (primary) hypertension: Secondary | ICD-10-CM | POA: Diagnosis not present

## 2016-12-31 DIAGNOSIS — D649 Anemia, unspecified: Secondary | ICD-10-CM | POA: Diagnosis not present

## 2016-12-31 DIAGNOSIS — Z510 Encounter for antineoplastic radiation therapy: Secondary | ICD-10-CM | POA: Insufficient documentation

## 2016-12-31 DIAGNOSIS — E785 Hyperlipidemia, unspecified: Secondary | ICD-10-CM | POA: Insufficient documentation

## 2016-12-31 DIAGNOSIS — F419 Anxiety disorder, unspecified: Secondary | ICD-10-CM | POA: Insufficient documentation

## 2016-12-31 DIAGNOSIS — E119 Type 2 diabetes mellitus without complications: Secondary | ICD-10-CM | POA: Insufficient documentation

## 2016-12-31 DIAGNOSIS — C50411 Malignant neoplasm of upper-outer quadrant of right female breast: Secondary | ICD-10-CM

## 2016-12-31 DIAGNOSIS — Z79899 Other long term (current) drug therapy: Secondary | ICD-10-CM | POA: Diagnosis not present

## 2016-12-31 DIAGNOSIS — M818 Other osteoporosis without current pathological fracture: Secondary | ICD-10-CM | POA: Insufficient documentation

## 2016-12-31 DIAGNOSIS — C50511 Malignant neoplasm of lower-outer quadrant of right female breast: Secondary | ICD-10-CM | POA: Diagnosis not present

## 2016-12-31 DIAGNOSIS — M129 Arthropathy, unspecified: Secondary | ICD-10-CM | POA: Diagnosis not present

## 2016-12-31 NOTE — Consult Note (Signed)
NEW PATIENT EVALUATION  Name: Pamela Hensley  MRN: [id number]  Date:   12/31/2016     DOB: 09-May-1935   This 82 y.o. female patient presents to the clinic for initial evaluation of stage I (T1 1AN 0 M0) invasive mammary carcinoma. Status post wide local excision and sentinel node biopsy.  REFERRING PHYSICIAN: Rusty Aus, MD  CHIEF COMPLAINT:  Chief Complaint  Patient presents with  . Breast Cancer    Pt is here for initial consultation of breast cancer.      DIAGNOSIS: There were no encounter diagnoses.   PREVIOUS INVESTIGATIONS:  Mammograms and ultrasound reviewed Pathology reports reviewed Clinical notes reviewed  HPI: Patient is an 82 year old female who presented with an abnormal mammogram of her right breast showing indeterminate breast mass at the 8:30 position 6 cm from the nipple. This was confirmed by ultrasound. Lesion measuring approximate 6 mm. She underwent ultrasound targeted biopsy which was positive for invasive mammary carcinoma overall grade 1. Margins were clear at 1 mm from the lateral margin and ductal carcinoma in situ was involved and margin was 0.5 mm from the lateral margin. 2 sentinel lymph nodes were negative. Tumor was strongly ER/PR positive HER-2/neu not overexpressed. Based on a small tumor size medical oncology has declined chemotherapy intervention. She is doing well. She is accompanied by her daughter today who works in the car nodal clinic. She specifically denies breast tenderness cough or bones pain.  PLANNED TREATMENT REGIMEN: Hypofractionated whole breast radiation  PAST MEDICAL HISTORY:  has a past medical history of Anemia; Anxiety; Arthritis; Atrial fibrillation ([place]); Cancer ([place]); Coronary artery disease; Diabetes mellitus without complication ([place]); Hyperlipidemia; Hypertension; Myocardial infarction ([place]) (08/05); and Osteoporosis.    PAST SURGICAL HISTORY:  Past Surgical History:  Procedure Laterality Date  . ABDOMINAL HYSTERECTOMY     . APPENDECTOMY    . BLADDER SURGERY    . COLON SURGERY  10/99   hemi-colectomy  . MASS EXCISION Right 04/15/2015   Procedure: MINOR EXCISION OF MASS INCLUSION RIGHT NECK CYST;  Surgeon: Beverly Gust, MD;  Location: [facility];  Service: ENT;  Laterality: Right;  ** local only **  . PARTIAL MASTECTOMY WITH NEEDLE LOCALIZATION Right 12/13/2016   Procedure: PARTIAL MASTECTOMY WITH NEEDLE LOCALIZATION;  Surgeon: Leonie Green, MD;  Location: [facility];  Service: General;  Laterality: Right;  . SENTINEL NODE BIOPSY Right 12/13/2016   Procedure: SENTINEL NODE BIOPSY;  Surgeon: Leonie Green, MD;  Location: [facility];  Service: General;  Laterality: Right;    FAMILY HISTORY: family history is not on file.  SOCIAL HISTORY:  reports that she has never smoked. She has never used smokeless tobacco. She reports that she does not drink alcohol or use drugs.  ALLERGIES: Patient has no known allergies.  MEDICATIONS:  Current Outpatient Prescriptions  Medication Sig Dispense Refill  . acetaminophen (TYLENOL) 325 MG tablet Take 325 mg by mouth every 6 (six) hours as needed for mild pain or headache.    Marland Kitchen aspirin 81 MG tablet Take 81 mg by mouth daily.    . cetirizine (ZYRTEC) 10 MG tablet Take 10 mg by mouth daily as needed for allergies.     . Cholecalciferol (VITAMIN D PO) Take 1 tablet by mouth daily.    . digoxin (LANOXIN) 0.125 MG tablet Take 0.125 mg by mouth daily. AM    . diltiazem (CARDIZEM) 120 MG tablet Take 120 mg by mouth daily. AM    . glimepiride (AMARYL) 2 MG tablet  TAKE (1) TABLET BY MOUTH DAILY FOR DIABETES    . hydrochlorothiazide (HYDRODIURIL) 25 MG tablet Take 25 mg by mouth daily as needed (swelling).     Marland Kitchen MAGNESIUM PO Take 250 mg by mouth every morning.     . metFORMIN (GLUCOPHAGE) 500 MG tablet Take 500 mg by mouth 2 (two) times daily with a meal.     . metoprolol succinate (TOPROL-XL) 50 MG 24 hr tablet Take 50 mg by mouth every morning. Take with or  immediately following a meal.     . simvastatin (ZOCOR) 40 MG tablet Take 40 mg by mouth daily at 6 PM. PM     . sotalol (BETAPACE) 80 MG tablet Take 80 mg by mouth 2 (two) times daily. AM     . venlafaxine (EFFEXOR) 75 MG tablet Take 75 mg by mouth daily. AM    . warfarin (COUMADIN) 3 MG tablet Take 4.5 mg by mouth daily. PM     No current facility-administered medications for this encounter.     ECOG PERFORMANCE STATUS:  0 - Asymptomatic  REVIEW OF SYSTEMS:  Patient denies any weight loss, fatigue, weakness, fever, chills or night sweats. Patient denies any loss of vision, blurred vision. Patient denies any ringing  of the ears or hearing loss. No irregular heartbeat. Patient denies heart murmur or history of fainting. Patient denies any chest pain or pain radiating to her upper extremities. Patient denies any shortness of breath, difficulty breathing at night, cough or hemoptysis. Patient denies any swelling in the lower legs. Patient denies any nausea vomiting, vomiting of blood, or coffee ground material in the vomitus. Patient denies any stomach pain. Patient states has had normal bowel movements no significant constipation or diarrhea. Patient denies any dysuria, hematuria or significant nocturia. Patient denies any problems walking, swelling in the joints or loss of balance. Patient denies any skin changes, loss of hair or loss of weight. Patient denies any excessive worrying or anxiety or significant depression. Patient denies any problems with insomnia. Patient denies excessive thirst, polyuria, polydipsia. Patient denies any swollen glands, patient denies easy bruising or easy bleeding. Patient denies any recent infections, allergies or URI. Patient "s visual fields have not changed significantly in recent time.    PHYSICAL EXAM: BP 132/83   Pulse 72   Temp (!) 96.6 F (35.9 C)   Resp 18   Wt 116 lb 15.3 oz (53 kg)   BMI 22.84 kg/m  She is status post wide local excision of the  right breast and sentinel node biopsy scar. Both areas are healing well. She has a small seroma in her right axilla near her sentinel node biopsy site. No dominant mass or nodularity is noted in either breast in 2 positions examined. No axillary or supra clavicular adenopathy is noted except for the seromas described above. Well-developed well-nourished patient in NAD. HEENT reveals PERLA, EOMI, discs not visualized.  Oral cavity is clear. No oral mucosal lesions are identified. Neck is clear without evidence of cervical or supraclavicular adenopathy. Lungs are clear to A&P. Cardiac examination is essentially unremarkable with regular rate and rhythm without murmur rub or thrill. Abdomen is benign with no organomegaly or masses noted. Motor sensory and DTR levels are equal and symmetric in the upper and lower extremities. Cranial nerves II through XII are grossly intact. Proprioception is intact. No peripheral adenopathy or edema is identified. No motor or sensory levels are noted. Crude visual fields are within normal range.  LABORATORY DATA: Pathology reports  reviewed    RADIOLOGY RESULTS: Mammogram and ultrasound reviewed   IMPRESSION: Stage I invasive mammary carcinoma the right breast in 82 year old female ER/PR positive HER-2/neu not overexpressed status post wide local excision and sentinel node biopsy  PLAN: At this time I to go ahead with whole breast radiation to her right breast. Would try to treat over 4 weeks boosting her scar another 1600 cGy based on the close DCIS margin as well as invasive component margin. Risks and benefits of treatment including skin reaction fatigue alteration of blood counts possible inclusion of superficial lung all were described in detail to the patient and her daughter. They both seem to comprehend my treatment plan well. I personally ordered and scheduled CT simulation for later this week. Patient also will be candidate for antiestrogen therapy after completion  of radiation.  I would like to take this opportunity to thank you for allowing me to participate in the care of your patient.Armstead Peaks., MD

## 2017-01-02 ENCOUNTER — Ambulatory Visit
Admission: RE | Admit: 2017-01-02 | Discharge: 2017-01-02 | Disposition: A | Discharge status: Home / Self Care | Payer: Medicare Other | Source: Ambulatory Visit | Attending: Radiation Oncology | Admitting: Radiation Oncology

## 2017-01-02 DIAGNOSIS — C50511 Malignant neoplasm of lower-outer quadrant of right female breast: Secondary | ICD-10-CM | POA: Diagnosis not present

## 2017-01-04 DIAGNOSIS — C50511 Malignant neoplasm of lower-outer quadrant of right female breast: Secondary | ICD-10-CM | POA: Diagnosis not present

## 2017-01-07 ENCOUNTER — Other Ambulatory Visit: Payer: Self-pay | Admitting: *Deleted

## 2017-01-07 DIAGNOSIS — C50511 Malignant neoplasm of lower-outer quadrant of right female breast: Secondary | ICD-10-CM

## 2017-01-10 ENCOUNTER — Ambulatory Visit
Admission: RE | Admit: 2017-01-10 | Discharge: 2017-01-10 | Disposition: A | Discharge status: Home / Self Care | Payer: Medicare Other | Source: Ambulatory Visit | Attending: Radiation Oncology | Admitting: Radiation Oncology

## 2017-01-10 DIAGNOSIS — C50511 Malignant neoplasm of lower-outer quadrant of right female breast: Secondary | ICD-10-CM | POA: Diagnosis not present

## 2017-01-14 ENCOUNTER — Ambulatory Visit
Admission: RE | Admit: 2017-01-14 | Discharge: 2017-01-14 | Disposition: A | Discharge status: Home / Self Care | Payer: Medicare Other | Source: Ambulatory Visit | Attending: Radiation Oncology | Admitting: Radiation Oncology

## 2017-01-14 DIAGNOSIS — C50511 Malignant neoplasm of lower-outer quadrant of right female breast: Secondary | ICD-10-CM | POA: Diagnosis not present

## 2017-01-15 ENCOUNTER — Ambulatory Visit
Admission: RE | Admit: 2017-01-15 | Discharge: 2017-01-15 | Disposition: A | Discharge status: Home / Self Care | Payer: Medicare Other | Source: Ambulatory Visit | Attending: Radiation Oncology | Admitting: Radiation Oncology

## 2017-01-15 DIAGNOSIS — C50511 Malignant neoplasm of lower-outer quadrant of right female breast: Secondary | ICD-10-CM | POA: Diagnosis not present

## 2017-01-16 ENCOUNTER — Ambulatory Visit
Admission: RE | Admit: 2017-01-16 | Discharge: 2017-01-16 | Disposition: A | Discharge status: Home / Self Care | Payer: Medicare Other | Source: Ambulatory Visit | Attending: Radiation Oncology | Admitting: Radiation Oncology

## 2017-01-16 DIAGNOSIS — C50511 Malignant neoplasm of lower-outer quadrant of right female breast: Secondary | ICD-10-CM | POA: Diagnosis not present

## 2017-01-17 ENCOUNTER — Ambulatory Visit
Admission: RE | Admit: 2017-01-17 | Discharge: 2017-01-17 | Disposition: A | Discharge status: Home / Self Care | Payer: Medicare Other | Source: Ambulatory Visit | Attending: Radiation Oncology | Admitting: Radiation Oncology

## 2017-01-17 DIAGNOSIS — C50511 Malignant neoplasm of lower-outer quadrant of right female breast: Secondary | ICD-10-CM | POA: Diagnosis not present

## 2017-01-18 ENCOUNTER — Ambulatory Visit
Admission: RE | Admit: 2017-01-18 | Discharge: 2017-01-18 | Disposition: A | Discharge status: Home / Self Care | Payer: Medicare Other | Source: Ambulatory Visit | Attending: Radiation Oncology | Admitting: Radiation Oncology

## 2017-01-18 DIAGNOSIS — C50511 Malignant neoplasm of lower-outer quadrant of right female breast: Secondary | ICD-10-CM | POA: Diagnosis not present

## 2017-01-21 ENCOUNTER — Ambulatory Visit: Payer: Medicare Other

## 2017-01-22 ENCOUNTER — Ambulatory Visit
Admission: RE | Admit: 2017-01-22 | Discharge: 2017-01-22 | Disposition: A | Discharge status: Home / Self Care | Payer: Medicare Other | Source: Ambulatory Visit | Attending: Radiation Oncology | Admitting: Radiation Oncology

## 2017-01-23 ENCOUNTER — Ambulatory Visit
Admission: RE | Admit: 2017-01-23 | Discharge: 2017-01-23 | Disposition: A | Discharge status: Home / Self Care | Payer: Medicare Other | Source: Ambulatory Visit | Attending: Radiation Oncology | Admitting: Radiation Oncology

## 2017-01-24 ENCOUNTER — Ambulatory Visit
Admission: RE | Admit: 2017-01-24 | Discharge: 2017-01-24 | Disposition: A | Discharge status: Home / Self Care | Payer: Medicare Other | Source: Ambulatory Visit | Attending: Radiation Oncology | Admitting: Radiation Oncology

## 2017-01-24 ENCOUNTER — Inpatient Hospital Stay: Payer: Medicare Other | Attending: Radiation Oncology

## 2017-01-24 DIAGNOSIS — Z170 Estrogen receptor positive status [ER+]: Secondary | ICD-10-CM | POA: Insufficient documentation

## 2017-01-24 DIAGNOSIS — C50511 Malignant neoplasm of lower-outer quadrant of right female breast: Secondary | ICD-10-CM | POA: Diagnosis not present

## 2017-01-24 LAB — CBC
HCT: 34.2 % — ABNORMAL LOW (ref 35.0–47.0)
Hemoglobin: 11.7 g/dL — ABNORMAL LOW (ref 12.0–16.0)
MCH: 29.8 pg (ref 26.0–34.0)
MCHC: 34.1 g/dL (ref 32.0–36.0)
MCV: 87.4 fL (ref 80.0–100.0)
PLATELETS: 137 10*3/uL — AB (ref 150–440)
RBC: 3.91 MIL/uL (ref 3.80–5.20)
RDW: 13.5 % (ref 11.5–14.5)
WBC: 7.9 10*3/uL (ref 3.6–11.0)

## 2017-01-25 ENCOUNTER — Emergency Department: Payer: Medicare Other

## 2017-01-25 ENCOUNTER — Other Ambulatory Visit: Payer: Self-pay

## 2017-01-25 ENCOUNTER — Inpatient Hospital Stay
Admission: EM | Admit: 2017-01-25 | Discharge: 2017-01-28 | DRG: 481 | Disposition: A | Discharge status: Home Health | Payer: Medicare Other | Attending: Internal Medicine | Admitting: Internal Medicine

## 2017-01-25 ENCOUNTER — Encounter: Payer: Self-pay | Admitting: Emergency Medicine

## 2017-01-25 ENCOUNTER — Ambulatory Visit
Admission: RE | Admit: 2017-01-25 | Discharge: 2017-01-25 | Disposition: A | Discharge status: Home / Self Care | Payer: Medicare Other | Source: Ambulatory Visit | Attending: Radiation Oncology | Admitting: Radiation Oncology

## 2017-01-25 DIAGNOSIS — I10 Essential (primary) hypertension: Secondary | ICD-10-CM | POA: Diagnosis present

## 2017-01-25 DIAGNOSIS — D696 Thrombocytopenia, unspecified: Secondary | ICD-10-CM | POA: Diagnosis present

## 2017-01-25 DIAGNOSIS — Y92008 Other place in unspecified non-institutional (private) residence as the place of occurrence of the external cause: Secondary | ICD-10-CM | POA: Diagnosis not present

## 2017-01-25 DIAGNOSIS — W19XXXA Unspecified fall, initial encounter: Secondary | ICD-10-CM

## 2017-01-25 DIAGNOSIS — Z7901 Long term (current) use of anticoagulants: Secondary | ICD-10-CM

## 2017-01-25 DIAGNOSIS — W010XXA Fall on same level from slipping, tripping and stumbling without subsequent striking against object, initial encounter: Secondary | ICD-10-CM | POA: Diagnosis present

## 2017-01-25 DIAGNOSIS — Z79899 Other long term (current) drug therapy: Secondary | ICD-10-CM

## 2017-01-25 DIAGNOSIS — I482 Chronic atrial fibrillation, unspecified: Secondary | ICD-10-CM | POA: Diagnosis present

## 2017-01-25 DIAGNOSIS — Z7982 Long term (current) use of aspirin: Secondary | ICD-10-CM | POA: Diagnosis not present

## 2017-01-25 DIAGNOSIS — F419 Anxiety disorder, unspecified: Secondary | ICD-10-CM | POA: Diagnosis present

## 2017-01-25 DIAGNOSIS — Z853 Personal history of malignant neoplasm of breast: Secondary | ICD-10-CM

## 2017-01-25 DIAGNOSIS — I252 Old myocardial infarction: Secondary | ICD-10-CM | POA: Diagnosis not present

## 2017-01-25 DIAGNOSIS — E871 Hypo-osmolality and hyponatremia: Secondary | ICD-10-CM | POA: Diagnosis present

## 2017-01-25 DIAGNOSIS — E785 Hyperlipidemia, unspecified: Secondary | ICD-10-CM | POA: Diagnosis present

## 2017-01-25 DIAGNOSIS — Z9889 Other specified postprocedural states: Secondary | ICD-10-CM

## 2017-01-25 DIAGNOSIS — R791 Abnormal coagulation profile: Secondary | ICD-10-CM | POA: Diagnosis present

## 2017-01-25 DIAGNOSIS — R001 Bradycardia, unspecified: Secondary | ICD-10-CM | POA: Diagnosis present

## 2017-01-25 DIAGNOSIS — Z8781 Personal history of (healed) traumatic fracture: Secondary | ICD-10-CM

## 2017-01-25 DIAGNOSIS — T45515A Adverse effect of anticoagulants, initial encounter: Secondary | ICD-10-CM | POA: Diagnosis present

## 2017-01-25 DIAGNOSIS — S72002A Fracture of unspecified part of neck of left femur, initial encounter for closed fracture: Secondary | ICD-10-CM

## 2017-01-25 DIAGNOSIS — N183 Chronic kidney disease, stage 3 unspecified: Secondary | ICD-10-CM | POA: Diagnosis present

## 2017-01-25 DIAGNOSIS — S72012A Unspecified intracapsular fracture of left femur, initial encounter for closed fracture: Secondary | ICD-10-CM | POA: Diagnosis present

## 2017-01-25 DIAGNOSIS — E1122 Type 2 diabetes mellitus with diabetic chronic kidney disease: Secondary | ICD-10-CM | POA: Diagnosis present

## 2017-01-25 DIAGNOSIS — D649 Anemia, unspecified: Secondary | ICD-10-CM

## 2017-01-25 DIAGNOSIS — M810 Age-related osteoporosis without current pathological fracture: Secondary | ICD-10-CM | POA: Diagnosis present

## 2017-01-25 DIAGNOSIS — I129 Hypertensive chronic kidney disease with stage 1 through stage 4 chronic kidney disease, or unspecified chronic kidney disease: Secondary | ICD-10-CM | POA: Diagnosis present

## 2017-01-25 DIAGNOSIS — Z7984 Long term (current) use of oral hypoglycemic drugs: Secondary | ICD-10-CM | POA: Diagnosis not present

## 2017-01-25 DIAGNOSIS — I2510 Atherosclerotic heart disease of native coronary artery without angina pectoris: Secondary | ICD-10-CM | POA: Diagnosis present

## 2017-01-25 DIAGNOSIS — D62 Acute posthemorrhagic anemia: Secondary | ICD-10-CM | POA: Diagnosis not present

## 2017-01-25 DIAGNOSIS — E119 Type 2 diabetes mellitus without complications: Secondary | ICD-10-CM

## 2017-01-25 DIAGNOSIS — R0602 Shortness of breath: Secondary | ICD-10-CM | POA: Diagnosis present

## 2017-01-25 LAB — CBC WITH DIFFERENTIAL/PLATELET
BASOS ABS: 0 10*3/uL (ref 0–0.1)
BASOS PCT: 0 %
EOS ABS: 0.1 10*3/uL (ref 0–0.7)
EOS PCT: 2 %
HEMATOCRIT: 34.6 % — AB (ref 35.0–47.0)
Hemoglobin: 11.7 g/dL — ABNORMAL LOW (ref 12.0–16.0)
Lymphocytes Relative: 13 %
Lymphs Abs: 1 10*3/uL (ref 1.0–3.6)
MCH: 29.8 pg (ref 26.0–34.0)
MCHC: 33.7 g/dL (ref 32.0–36.0)
MCV: 88.3 fL (ref 80.0–100.0)
MONO ABS: 0.6 10*3/uL (ref 0.2–0.9)
MONOS PCT: 8 %
NEUTROS ABS: 6.2 10*3/uL (ref 1.4–6.5)
Neutrophils Relative %: 77 %
PLATELETS: 127 10*3/uL — AB (ref 150–440)
RBC: 3.92 MIL/uL (ref 3.80–5.20)
RDW: 13.6 % (ref 11.5–14.5)
WBC: 8 10*3/uL (ref 3.6–11.0)

## 2017-01-25 LAB — BASIC METABOLIC PANEL
ANION GAP: 6 (ref 5–15)
BUN: 29 mg/dL — ABNORMAL HIGH (ref 6–20)
CALCIUM: 9 mg/dL (ref 8.9–10.3)
CO2: 25 mmol/L (ref 22–32)
CREATININE: 1.19 mg/dL — AB (ref 0.44–1.00)
Chloride: 100 mmol/L — ABNORMAL LOW (ref 101–111)
GFR, EST AFRICAN AMERICAN: 49 mL/min — AB (ref >60)
GFR, EST NON AFRICAN AMERICAN: 42 mL/min — AB (ref >60)
Glucose, Bld: 137 mg/dL — ABNORMAL HIGH (ref 65–99)
Potassium: 4 mmol/L (ref 3.5–5.1)
SODIUM: 131 mmol/L — AB (ref 135–145)

## 2017-01-25 LAB — DIGOXIN LEVEL: DIGOXIN LVL: 1.6 ng/mL (ref 0.8–2.0)

## 2017-01-25 LAB — PROTIME-INR
INR: 1.61
Prothrombin Time: 19.3 seconds — ABNORMAL HIGH (ref 11.4–15.2)

## 2017-01-25 LAB — TYPE AND SCREEN
ABO/RH(D): A POS
Antibody Screen: NEGATIVE

## 2017-01-25 LAB — APTT: APTT: 27 s (ref 24–36)

## 2017-01-25 MED ORDER — VITAMIN K1 10 MG/ML IJ SOLN: 10.0000 mg | INTRAVENOUS | Status: DC

## 2017-01-25 MED ORDER — OXYCODONE HCL 5 MG PO TABS
5.0000 mg | ORAL_TABLET | ORAL | Status: DC | PRN
  Administered 2017-01-25: 5 mg via ORAL
  Filled 2017-01-25: qty 1

## 2017-01-25 MED ORDER — ASPIRIN EC 81 MG PO TBEC
81.0000 mg | DELAYED_RELEASE_TABLET | Freq: Every day | ORAL | Status: DC
  Administered 2017-01-27 – 2017-01-28 (×2): 81 mg via ORAL
  Filled 2017-01-25 (×2): qty 1

## 2017-01-25 MED ORDER — ONDANSETRON HCL 4 MG PO TABS: 4.0000 mg | ORAL_TABLET | Freq: Four times a day (QID) | ORAL | Status: DC | PRN

## 2017-01-25 MED ORDER — ONDANSETRON HCL 4 MG/2ML IJ SOLN
4.0000 mg | Freq: Once | INTRAMUSCULAR | Status: AC
  Administered 2017-01-25: 4 mg via INTRAVENOUS

## 2017-01-25 MED ORDER — DIGOXIN 125 MCG PO TABS
0.1250 mg | ORAL_TABLET | Freq: Every day | ORAL | Status: DC
  Administered 2017-01-27 – 2017-01-28 (×2): 0.125 mg via ORAL
  Filled 2017-01-25 (×3): qty 1

## 2017-01-25 MED ORDER — SOTALOL HCL 80 MG PO TABS
80.0000 mg | ORAL_TABLET | Freq: Two times a day (BID) | ORAL | Status: DC
  Administered 2017-01-25 – 2017-01-28 (×5): 80 mg via ORAL
  Filled 2017-01-25 (×7): qty 1

## 2017-01-25 MED ORDER — VITAMIN K1 10 MG/ML IJ SOLN
1.0000 mg | Freq: Once | INTRAMUSCULAR | Status: AC
  Administered 2017-01-25: 1 mg via INTRAVENOUS
  Filled 2017-01-25: qty 0.1

## 2017-01-25 MED ORDER — ONDANSETRON HCL 4 MG/2ML IJ SOLN
INTRAMUSCULAR | Status: AC
  Filled 2017-01-25: qty 2

## 2017-01-25 MED ORDER — CEFAZOLIN SODIUM-DEXTROSE 2-4 GM/100ML-% IV SOLN
2.0000 g | INTRAVENOUS | Status: AC
  Administered 2017-01-26: 2 g via INTRAVENOUS
  Filled 2017-01-25: qty 100

## 2017-01-25 MED ORDER — DILTIAZEM HCL 30 MG PO TABS
120.0000 mg | ORAL_TABLET | Freq: Every day | ORAL | Status: DC
  Administered 2017-01-27 – 2017-01-28 (×2): 120 mg via ORAL
  Filled 2017-01-25 (×2): qty 4

## 2017-01-25 MED ORDER — VENLAFAXINE HCL 75 MG PO TABS
75.0000 mg | ORAL_TABLET | Freq: Every day | ORAL | Status: DC
  Administered 2017-01-27 – 2017-01-28 (×2): 75 mg via ORAL
  Filled 2017-01-25 (×3): qty 1

## 2017-01-25 MED ORDER — SIMVASTATIN 20 MG PO TABS
40.0000 mg | ORAL_TABLET | Freq: Every day | ORAL | Status: DC
  Administered 2017-01-26 – 2017-01-27 (×2): 40 mg via ORAL
  Filled 2017-01-25 (×2): qty 2

## 2017-01-25 MED ORDER — ONDANSETRON HCL 4 MG/2ML IJ SOLN: 4.0000 mg | Freq: Four times a day (QID) | INTRAMUSCULAR | Status: DC | PRN

## 2017-01-25 MED ORDER — ACETAMINOPHEN 325 MG PO TABS: 650.0000 mg | ORAL_TABLET | Freq: Four times a day (QID) | ORAL | Status: DC | PRN

## 2017-01-25 MED ORDER — MORPHINE SULFATE (PF) 4 MG/ML IV SOLN
4.0000 mg | Freq: Once | INTRAVENOUS | Status: AC
  Administered 2017-01-25: 4 mg via INTRAVENOUS

## 2017-01-25 MED ORDER — MORPHINE SULFATE (PF) 4 MG/ML IV SOLN: 4.0000 mg | INTRAVENOUS | Status: DC | PRN

## 2017-01-25 MED ORDER — MORPHINE SULFATE (PF) 4 MG/ML IV SOLN
INTRAVENOUS | Status: AC
  Filled 2017-01-25: qty 1

## 2017-01-25 MED ORDER — ACETAMINOPHEN 650 MG RE SUPP: 650.0000 mg | Freq: Four times a day (QID) | RECTAL | Status: DC | PRN

## 2017-01-25 MED ORDER — METOPROLOL SUCCINATE ER 50 MG PO TB24
50.0000 mg | ORAL_TABLET | ORAL | Status: DC
  Administered 2017-01-26: 50 mg via ORAL
  Filled 2017-01-25: qty 1

## 2017-01-25 NOTE — H&P (Signed)
[facility] [place] NAME: Pamela Hensley    MR#:  [id number]  DATE OF BIRTH:  04/19/1935  DATE OF ADMISSION:  01/25/2017  PRIMARY CARE PHYSICIAN: Rusty Aus, MD   REQUESTING/REFERRING PHYSICIAN: Clearnce Hasten, MD  CHIEF COMPLAINT:   Chief Complaint  Patient presents with  . Fall    HISTORY OF PRESENT ILLNESS:  Pamela Hensley  is a 82 y.o. female who presents with Mechanical fall in her backyard, with subsequent left hip fracture. This is confirmed by imaging here in the ED. Patient is on Coumadin, but her INR is 1.6. Orthopedic surgery was consulted, saw her and plans for operative repair tomorrow morning. Hospitalists were called for admission.  PAST MEDICAL HISTORY:   Past Medical History:  Diagnosis Date  . Anemia   . Anxiety   . Arthritis    osteoarthritis  . Atrial fibrillation ([place])   . Cancer (HCC)    Breast  . Coronary artery disease   . Diabetes mellitus without complication ([place])   . Hyperlipidemia   . Hypertension   . Myocardial infarction ([place]) 08/05  . Osteoporosis     PAST SURGICAL HISTORY:   Past Surgical History:  Procedure Laterality Date  . ABDOMINAL HYSTERECTOMY    . APPENDECTOMY    . BLADDER SURGERY    . COLON SURGERY  10/99   hemi-colectomy  . MASS EXCISION Right 04/15/2015   Procedure: MINOR EXCISION OF MASS INCLUSION RIGHT NECK CYST;  Surgeon: Beverly Gust, MD;  Location: [place];  Service: ENT;  Laterality: Right;  ** local only **  . PARTIAL MASTECTOMY WITH NEEDLE LOCALIZATION Right 12/13/2016   Procedure: PARTIAL MASTECTOMY WITH NEEDLE LOCALIZATION;  Surgeon: Leonie Green, MD;  Location: [facility];  Service: General;  Laterality: Right;  . SENTINEL NODE BIOPSY Right 12/13/2016   Procedure: SENTINEL NODE BIOPSY;  Surgeon: Leonie Green, MD;  Location: [facility];  Service: General;  Laterality: Right;    SOCIAL HISTORY:   Social History  Substance Use Topics  .  Smoking status: Never Smoker  . Smokeless tobacco: Never Used  . Alcohol use No    FAMILY HISTORY:   Family History  Problem Relation Age of Onset  . Breast cancer Neg Hx     DRUG ALLERGIES:  No Known Allergies  MEDICATIONS AT HOME:   Prior to Admission medications   Medication Sig Start Date End Date Taking? Authorizing Provider  acetaminophen (TYLENOL) 325 MG tablet Take 325 mg by mouth every 6 (six) hours as needed for mild pain or headache.   Yes [provider]  aspirin 81 MG tablet Take 81 mg by mouth daily.   Yes [provider]  cetirizine (ZYRTEC) 10 MG tablet Take 10 mg by mouth daily as needed for allergies.    Yes [provider]  Cholecalciferol (VITAMIN D PO) Take 1 tablet by mouth daily.   Yes [provider]  digoxin (LANOXIN) 0.125 MG tablet Take 0.125 mg by mouth daily. AM   Yes [provider]  diltiazem (CARDIZEM) 120 MG tablet Take 120 mg by mouth daily. AM   Yes [provider]  glimepiride (AMARYL) 2 MG tablet TAKE (1) TABLET BY MOUTH DAILY FOR DIABETES 08/22/16  Yes [provider]  hydrochlorothiazide (HYDRODIURIL) 25 MG tablet Take 25 mg by mouth daily as needed (swelling).    Yes [provider]  MAGNESIUM PO Take 250 mg by mouth every morning.  Yes [provider]  metFORMIN (GLUCOPHAGE) 500 MG tablet Take 500 mg by mouth 2 (two) times daily with a meal.    Yes [provider]  metoprolol succinate (TOPROL-XL) 50 MG 24 hr tablet Take 50 mg by mouth every morning. Take with or immediately following a meal.    Yes [provider]  simvastatin (ZOCOR) 40 MG tablet Take 40 mg by mouth daily at 6 PM. PM    Yes [provider]  sotalol (BETAPACE) 80 MG tablet Take 80 mg by mouth 2 (two) times daily. AM    Yes [provider]  venlafaxine (EFFEXOR) 75 MG tablet Take 75 mg by mouth daily. AM   Yes [provider]  warfarin (COUMADIN) 3 MG  tablet Take 4.5 mg by mouth daily. PM   Yes [provider]    REVIEW OF SYSTEMS:  Review of Systems  Constitutional: Negative for chills, fever, malaise/fatigue and weight loss.  HENT: Negative for ear pain, hearing loss and tinnitus.   Eyes: Negative for blurred vision, double vision, pain and redness.  Respiratory: Negative for cough, hemoptysis and shortness of breath.   Cardiovascular: Negative for chest pain, palpitations, orthopnea and leg swelling.  Gastrointestinal: Negative for abdominal pain, constipation, diarrhea, nausea and vomiting.  Genitourinary: Negative for dysuria, frequency and hematuria.  Musculoskeletal: Positive for falls and joint pain (Left hip). Negative for back pain and neck pain.  Skin:       No acne, rash, or lesions  Neurological: Negative for dizziness, tremors, focal weakness and weakness.  Endo/Heme/Allergies: Negative for polydipsia. Does not bruise/bleed easily.  Psychiatric/Behavioral: Negative for depression. The patient is not nervous/anxious and does not have insomnia.      VITAL SIGNS:   Vitals:   01/25/17 1721 01/25/17 1900 01/25/17 2000  BP: 126/73 137/85 140/90  Pulse: 64 (!) 57 (!) 58  Resp: 16 15 17   Temp: 97.9 F (36.6 C)    TempSrc: Oral    SpO2: 97% 97% 98%   Wt Readings from Last 3 Encounters:  12/31/16 53 kg (116 lb 15.3 oz)  12/27/16 53.8 kg (118 lb 8 oz)  12/13/16 52.6 kg (116 lb)    PHYSICAL EXAMINATION:  Physical Exam  Vitals reviewed. Constitutional: She is oriented to person, place, and time. She appears well-developed and well-nourished. No distress.  HENT:  Head: Normocephalic and atraumatic.  Mouth/Throat: Oropharynx is clear and moist.  Eyes: Pupils are equal, round, and reactive to light. Conjunctivae and EOM are normal. No scleral icterus.  Neck: Normal range of motion. Neck supple. No JVD present. No thyromegaly present.  Cardiovascular: Normal rate and intact distal pulses.  Exam reveals no  gallop and no friction rub.   No murmur heard. Irregular rhythm  Respiratory: Effort normal and breath sounds normal. No respiratory distress. She has no wheezes. She has no rales.  GI: Soft. Bowel sounds are normal. She exhibits no distension. There is no tenderness.  Musculoskeletal: Normal range of motion. She exhibits tenderness (Left hip). She exhibits no edema.  No arthritis, no gout  Lymphadenopathy:    She has no cervical adenopathy.  Neurological: She is alert and oriented to person, place, and time. No cranial nerve deficit.  No dysarthria, no aphasia  Skin: Skin is warm and dry. No rash noted. No erythema.  Psychiatric: She has a normal mood and affect. Her behavior is normal. Judgment and thought content normal.    LABORATORY PANEL:   CBC  Recent Labs Lab 01/25/17  1745  WBC 8.0  HGB 11.7*  HCT 34.6*  PLT 127*   ------------------------------------------------------------------------------------------------------------------  Chemistries   Recent Labs Lab 01/25/17 1745  NA 131*  K 4.0  CL 100*  CO2 25  GLUCOSE 137*  BUN 29*  CREATININE 1.19*  CALCIUM 9.0   ------------------------------------------------------------------------------------------------------------------  Cardiac Enzymes No results for input(s): TROPONINI in the last 168 hours. ------------------------------------------------------------------------------------------------------------------  RADIOLOGY:  Dg Chest 1 View  Result Date: 01/25/2017 CLINICAL DATA:  Fall EXAM: CHEST 1 VIEW COMPARISON:  None. FINDINGS: Lungs are clear.  No pleural effusion or pneumothorax. The heart is normal in size. IMPRESSION: No evidence of acute cardiopulmonary disease. Electronically Signed   By: Julian Hy M.D.   On: 01/25/2017 17:41   Dg Hip Unilat W Or Wo Pelvis 2-3 Views Left  Result Date: 01/25/2017 CLINICAL DATA:  Patient tripped over a power cord and fell. EXAM: DG HIP (WITH OR WITHOUT  PELVIS) 2-3V LEFT COMPARISON:  None. FINDINGS: Frontal pelvis shows diffuse bony demineralization. The SI joints and symphysis pubis are unremarkable. AP and cross-table lateral views of the left hip show a subcapital to transcervical femoral neck fracture with mild valgus angulation. IMPRESSION: Acute femoral neck fracture with mild valgus deformity. Electronically Signed   By: Misty Stanley M.D.   On: 01/25/2017 17:42    EKG:   Orders placed or performed during the hospital encounter of 01/25/17  . ED EKG  . ED EKG    IMPRESSION AND PLAN:  Principal Problem:   Closed left hip fracture [facility]) - surgical repair planned by orthopedic surgery. We will do further recommendations for this. Cardiac risk stratification as below. Patient has one risk factor for below in the form of coronary artery disease. She does have a history of A. fib and is on a number of rate controlling medications.   Active Problems:   CAD (coronary artery disease) - continue home meds   Chronic atrial fibrillation (HCC) - continue rate controlling medications, hold anticoagulation for now as the patient is going for surgery tomorrow.   Benign essential hypertension - continue home meds   CKD (chronic kidney disease), stage III - at baseline, avoid nephrotoxins and monitor  All the records are reviewed and case discussed with ED provider. Management plans discussed with the patient and/or family.  DVT PROPHYLAXIS: Mechanical only  GI PROPHYLAXIS: None  ADMISSION STATUS: Inpatient  CODE STATUS: Full Code Status History    This patient does not have a recorded code status. Please follow your organizational policy for patients in this situation.      TOTAL TIME TAKING CARE OF THIS PATIENT: 45 minutes.   Ranveer Wahlstrom [place] 01/25/2017, 8:52 PM  [facility]  Office  [phone]  CC: Primary care physician; Rusty Aus, MD  Note:  This document was prepared using Dragon voice  recognition software and may include unintentional dictation errors.

## 2017-01-25 NOTE — Consult Note (Signed)
ORTHOPAEDIC CONSULTATION  REQUESTING PHYSICIAN: Orbie Pyo*  Chief Complaint: Left hip pain status post fall  HPI: Pamela Hensley is a 82 y.o. female who complains of  pain in the left hip after a fall at home today. Patient explains that she was blowing leaves of her patio and tripped over the power cord. She fell onto her left side injuring her left hip.  X-rays taken at [facility] ER upon her arrival confirmed a valgus impacted left femoral neck hip fracture. Patient denies other injuries. She has a history of atrial fibrillation and is on Coumadin. Her last INR is currently 1.6.  She is also receiving radiation therapy for breast cancer at the [facility] currently. Her next radiation treatment is on Monday.  Past Medical History:  Diagnosis Date  . Anemia   . Anxiety   . Arthritis    osteoarthritis  . Atrial fibrillation ([place])   . Cancer (HCC)    Breast  . Coronary artery disease   . Diabetes mellitus without complication ([place])   . Hyperlipidemia   . Hypertension   . Myocardial infarction ([place]) 08/05  . Osteoporosis    Past Surgical History:  Procedure Laterality Date  . ABDOMINAL HYSTERECTOMY    . APPENDECTOMY    . BLADDER SURGERY    . COLON SURGERY  10/99   hemi-colectomy  . MASS EXCISION Right 04/15/2015   Procedure: MINOR EXCISION OF MASS INCLUSION RIGHT NECK CYST;  Surgeon: Beverly Gust, MD;  Location: [place];  Service: ENT;  Laterality: Right;  ** local only **  . PARTIAL MASTECTOMY WITH NEEDLE LOCALIZATION Right 12/13/2016   Procedure: PARTIAL MASTECTOMY WITH NEEDLE LOCALIZATION;  Surgeon: Leonie Green, MD;  Location: [facility];  Service: General;  Laterality: Right;  . SENTINEL NODE BIOPSY Right 12/13/2016   Procedure: SENTINEL NODE BIOPSY;  Surgeon: Leonie Green, MD;  Location: [facility];  Service: General;  Laterality: Right;   Social History   Social History  . Marital status: Married   Spouse name: N/A  . Number of children: N/A  . Years of education: N/A   Social History Main Topics  . Smoking status: Never Smoker  . Smokeless tobacco: Never Used  . Alcohol use No  . Drug use: No  . Sexual activity: Not Currently   Other Topics Concern  . None   Social History Narrative  . None   Family History  Problem Relation Age of Onset  . Breast cancer Neg Hx    No Known Allergies Prior to Admission medications   Medication Sig Start Date End Date Taking? Authorizing Provider  acetaminophen (TYLENOL) 325 MG tablet Take 325 mg by mouth every 6 (six) hours as needed for mild pain or headache.   Yes [provider]  aspirin 81 MG tablet Take 81 mg by mouth daily.   Yes [provider]  cetirizine (ZYRTEC) 10 MG tablet Take 10 mg by mouth daily as needed for allergies.    Yes [provider]  Cholecalciferol (VITAMIN D PO) Take 1 tablet by mouth daily.   Yes [provider]  digoxin (LANOXIN) 0.125 MG tablet Take 0.125 mg by mouth daily. AM   Yes [provider]  diltiazem (CARDIZEM) 120 MG tablet Take 120 mg by mouth daily. AM   Yes [provider]  glimepiride (AMARYL) 2 MG tablet TAKE (1) TABLET BY MOUTH DAILY FOR DIABETES 08/22/16  Yes [provider]  hydrochlorothiazide (HYDRODIURIL) 25  MG tablet Take 25 mg by mouth daily as needed (swelling).    Yes [provider]  MAGNESIUM PO Take 250 mg by mouth every morning.    Yes [provider]  metFORMIN (GLUCOPHAGE) 500 MG tablet Take 500 mg by mouth 2 (two) times daily with a meal.    Yes [provider]  metoprolol succinate (TOPROL-XL) 50 MG 24 hr tablet Take 50 mg by mouth every morning. Take with or immediately following a meal.    Yes [provider]  simvastatin (ZOCOR) 40 MG tablet Take 40 mg by mouth daily at 6 PM. PM    Yes [provider]  sotalol (BETAPACE) 80 MG tablet Take 80 mg by mouth 2 (two) times  daily. AM    Yes [provider]  venlafaxine (EFFEXOR) 75 MG tablet Take 75 mg by mouth daily. AM   Yes [provider]  warfarin (COUMADIN) 3 MG tablet Take 4.5 mg by mouth daily. PM   Yes [provider]   Dg Chest 1 View  Result Date: 01/25/2017 CLINICAL DATA:  Fall EXAM: CHEST 1 VIEW COMPARISON:  None. FINDINGS: Lungs are clear.  No pleural effusion or pneumothorax. The heart is normal in size. IMPRESSION: No evidence of acute cardiopulmonary disease. Electronically Signed   By: Julian Hy M.D.   On: 01/25/2017 17:41   Dg Hip Unilat W Or Wo Pelvis 2-3 Views Left  Result Date: 01/25/2017 CLINICAL DATA:  Patient tripped over a power cord and fell. EXAM: DG HIP (WITH OR WITHOUT PELVIS) 2-3V LEFT COMPARISON:  None. FINDINGS: Frontal pelvis shows diffuse bony demineralization. The SI joints and symphysis pubis are unremarkable. AP and cross-table lateral views of the left hip show a subcapital to transcervical femoral neck fracture with mild valgus angulation. IMPRESSION: Acute femoral neck fracture with mild valgus deformity. Electronically Signed   By: Misty Stanley M.D.   On: 01/25/2017 17:42    Positive ROS: All other systems have been reviewed and were otherwise negative with the exception of those mentioned in the HPI and as above.  Physical Exam: General: Alert, no acute distress  MUSCULOSKELETAL: Left hip: Patient has intact sensation light touch in palpable pedal pulses. She can flex and extend her toes and dorsiflex and plantarflex her ankles bilaterally. There is no significant shortening or external rotation.  Patient's skin is intact.  Assessment: Left valgus impacted femoral neck hip fracture  Plan: I explained to the patient and her husband and daughter who were with her in the ER the nature of her fracture. I explained that she has a valgus impacted femoral neck hip fracture and then I would recommend surgical treatment with cannulated screws  for this fracture.  I explained the details of the operation as well as the postoperative course. I also explained to them the risks and benefits of the procedure. They understand the risks include but are not limited to infection, bleeding, nerve or blood vessel injury, persistent hip pain, avascular necrosis, malunion, nonunion, hardware failure and the need for further surgery. They understand the medical risks include but are not limited to DVT and pulmonary embolism, myocardial infarction, stroke, pneumonia, respiratory failure and death.  I have reviewed the labs and radiographically studies in preparation for this case. The patient's INR is currently 1.6.  Vitamin K IV has been ordered to lower the INR for surgery tomorrow. Patient's surgery is scheduled for 8 AM pending medical clearance. Patient is being admitted to the hospitalist service for  preoperative evaluation. I answered all questions by the patient and her family. They understand the risks and benefits of surgery and wished to proceed with surgical intervention.    Thornton Park, MD    01/25/2017 8:24 PM

## 2017-01-25 NOTE — ED Provider Notes (Signed)
[facility] Emergency Department Provider Note  ____________________________________________   First MD Initiated Contact with Patient 01/25/17 1758     (approximate)  I have reviewed the triage vital signs and the nursing notes.   HISTORY  Chief Complaint Fall   HPI Pamela Hensley is a 82 y.o. female with a history of osteoarthritisas well as atrial fibrillation on Coumadin who is presenting to the emergency department today after a fall. She says that she was blowing leaves when she tripped over the power cord of her leaf blower onto her left hip. She denies hitting her head, losing consciousness or any headache or neck pain at this time. However, she has been unable to move her left lower extremity since the accident.   Past Medical History:  Diagnosis Date  . Anemia   . Anxiety   . Arthritis    osteoarthritis  . Atrial fibrillation ([place])   . Cancer (HCC)    Breast  . Coronary artery disease   . Diabetes mellitus without complication ([place])   . Hyperlipidemia   . Hypertension   . Myocardial infarction ([place]) 08/05  . Osteoporosis     Patient Active Problem List   Diagnosis Date Noted  . Chronic kidney disease (CKD), stage IV (severe) ([place]) 11/30/2016  . Benign essential hypertension 11/13/2016  . Primary cancer of lower-outer quadrant of right breast ([place]) 11/11/2016  . Malignant neoplasm of lower-outer quadrant of right female breast ([place]) 10/25/2016  . Age-related osteoporosis without current pathological fracture 08/27/2016  . Chronic atrial fibrillation ([place]) 08/27/2016  . Medicare annual wellness visit, initial 08/27/2016  . Hyperlipidemia, mixed 02/23/2016  . Controlled type 2 diabetes mellitus without complication, without long-term current use of insulin ([place]) 08/24/2015  . CAD (coronary artery disease) 11/11/2013  . OA (osteoarthritis) 11/11/2013    Past Surgical History:  Procedure Laterality Date  . ABDOMINAL HYSTERECTOMY      . APPENDECTOMY    . BLADDER SURGERY    . COLON SURGERY  10/99   hemi-colectomy  . MASS EXCISION Right 04/15/2015   Procedure: MINOR EXCISION OF MASS INCLUSION RIGHT NECK CYST;  Surgeon: Beverly Gust, MD;  Location: [place];  Service: ENT;  Laterality: Right;  ** local only **  . PARTIAL MASTECTOMY WITH NEEDLE LOCALIZATION Right 12/13/2016   Procedure: PARTIAL MASTECTOMY WITH NEEDLE LOCALIZATION;  Surgeon: Leonie Green, MD;  Location: [facility];  Service: General;  Laterality: Right;  . SENTINEL NODE BIOPSY Right 12/13/2016   Procedure: SENTINEL NODE BIOPSY;  Surgeon: Leonie Green, MD;  Location: [facility];  Service: General;  Laterality: Right;    Prior to Admission medications   Medication Sig Start Date End Date Taking? Authorizing Provider  acetaminophen (TYLENOL) 325 MG tablet Take 325 mg by mouth every 6 (six) hours as needed for mild pain or headache.    [provider]  aspirin 81 MG tablet Take 81 mg by mouth daily.    [provider]  cetirizine (ZYRTEC) 10 MG tablet Take 10 mg by mouth daily as needed for allergies.     [provider]  Cholecalciferol (VITAMIN D PO) Take 1 tablet by mouth daily.    [provider]  digoxin (LANOXIN) 0.125 MG tablet Take 0.125 mg by mouth daily. AM    [provider]  diltiazem (CARDIZEM) 120 MG tablet Take 120 mg by mouth daily. AM    [provider]  glimepiride (AMARYL) 2 MG tablet TAKE (1) TABLET BY  MOUTH DAILY FOR DIABETES 08/22/16   [provider]  hydrochlorothiazide (HYDRODIURIL) 25 MG tablet Take 25 mg by mouth daily as needed (swelling).     [provider]  MAGNESIUM PO Take 250 mg by mouth every morning.     [provider]  metFORMIN (GLUCOPHAGE) 500 MG tablet Take 500 mg by mouth 2 (two) times daily with a meal.     [provider]  metoprolol succinate (TOPROL-XL) 50 MG 24 hr tablet Take 50 mg by mouth every morning.  Take with or immediately following a meal.     [provider]  simvastatin (ZOCOR) 40 MG tablet Take 40 mg by mouth daily at 6 PM. PM     [provider]  sotalol (BETAPACE) 80 MG tablet Take 80 mg by mouth 2 (two) times daily. AM     [provider]  venlafaxine (EFFEXOR) 75 MG tablet Take 75 mg by mouth daily. AM    [provider]  warfarin (COUMADIN) 3 MG tablet Take 4.5 mg by mouth daily. PM    [provider]    Allergies Patient has no known allergies.  Family History  Problem Relation Age of Onset  . Breast cancer Neg Hx     Social History Social History  Substance Use Topics  . Smoking status: Never Smoker  . Smokeless tobacco: Never Used  . Alcohol use No    Review of Systems  Constitutional: No fever/chills Eyes: No visual changes. ENT: No sore throat. Cardiovascular: Denies chest pain. Respiratory: Denies shortness of breath. Gastrointestinal: No abdominal pain.  No nausea, no vomiting.  No diarrhea.  No constipation. Genitourinary: Negative for dysuria. Musculoskeletal: Negative for back pain. Skin: Negative for rash. Neurological: Negative for headaches, focal weakness or numbness.   ____________________________________________   PHYSICAL EXAM:  VITAL SIGNS: ED Triage Vitals  Enc Vitals Group     BP 01/25/17 1721 126/73     Pulse Rate 01/25/17 1721 64     Resp 01/25/17 1721 16     Temp 01/25/17 1721 97.9 F (36.6 C)     Temp Source 01/25/17 1721 Oral     SpO2 01/25/17 1721 97 %     Weight --      Height --      Head Circumference --      Peak Flow --      Pain Score 01/25/17 1718 9     Pain Loc --      Pain Edu? --      Excl. in [place]? --     Constitutional: Alert and oriented. Well appearing and in no acute distress. Eyes: Conjunctivae are normal.  Head: Atraumatic. Nose: No congestion/rhinnorhea. Mouth/Throat: Mucous membranes are moist.  Neck: No stridor.   Cardiovascular: Normal rate,  regular rhythm. Grossly normal heart sounds.  Good peripheral circulation. Respiratory: Normal respiratory effort.  No retractions. Lungs CTAB. Gastrointestinal: Soft and nontender. No distention. Musculoskeletal: Tenderness to palpation over the lateral left hip. There is also a very small amount of left lower extremity shortening, approximately 1-2 cm. The patient is neurovascularly intact distally with bilateral and equal dorsalis pedis pulses. Sensation to light touch intact and the patient is able to range both of her ankles and toes. Neurologic:  Normal speech and language. No gross focal neurologic deficits are appreciated. Skin:  Skin is warm, dry and intact. No rash noted. Psychiatric: Mood and affect are normal. Speech and behavior are normal.  ____________________________________________   LABS (all  labs ordered are listed, but only abnormal results are displayed)  Labs Reviewed - No data to display ____________________________________________  EKG  Pending EKG. ____________________________________________  RADIOLOGY  Left femoral neck fracture.  Chest x-ray without acute cardiopulmonary disease. ____________________________________________   PROCEDURES  Procedure(s) performed:   Procedures  Critical Care performed:   ____________________________________________   INITIAL IMPRESSION / ASSESSMENT AND PLAN / ED COURSE  Pertinent labs & imaging results that were available during my care of the patient were reviewed by me and considered in my medical decision making (see chart for details).  ----------------------------------------- 6:07 PM on 01/25/2017 -----------------------------------------  Discussed case with Dr. Mack Guise orthopedics as well as Dr. Jannifer Franklin of the medicine service. Patient be admitted to the hospital. I explained the injury to the patient as well as her husband who is now the bedside. She understands the need for admission as well as  likely surgical repair. Pending INR as well as lab work and EKG at this time.      ____________________________________________   FINAL CLINICAL IMPRESSION(S) / ED DIAGNOSES  Final diagnoses:  SOB (shortness of breath)  Left hip fracture. Mechanical fall.    NEW MEDICATIONS STARTED DURING THIS VISIT:  New Prescriptions   No medications on file     Note:  This document was prepared using Dragon voice recognition software and may include unintentional dictation errors.     Orbie Pyo, MD 01/25/17 [phone]

## 2017-01-25 NOTE — ED Triage Notes (Signed)
Pt to ED via [facility] from home for fall today. Pt tripped over a power cord. Pt c/o left hip pain when moving. Pt states that she did not hit her head. Pt is on Coumadin for A. Fib.

## 2017-01-26 ENCOUNTER — Inpatient Hospital Stay: Payer: Medicare Other | Admitting: Anesthesiology

## 2017-01-26 ENCOUNTER — Inpatient Hospital Stay: Payer: Medicare Other

## 2017-01-26 ENCOUNTER — Encounter: Payer: Self-pay | Admitting: Anesthesiology

## 2017-01-26 ENCOUNTER — Encounter
Admission: EM | Disposition: A | Discharge status: Home Health | Payer: Self-pay | Source: Home / Self Care | Attending: Internal Medicine

## 2017-01-26 HISTORY — PX: HIP PINNING,CANNULATED: SHX1758

## 2017-01-26 LAB — CREATININE, SERUM
CREATININE: 1.21 mg/dL — AB (ref 0.44–1.00)
GFR, EST AFRICAN AMERICAN: 48 mL/min — AB (ref >60)
GFR, EST NON AFRICAN AMERICAN: 41 mL/min — AB (ref >60)

## 2017-01-26 LAB — MRSA PCR SCREENING: MRSA by PCR: NEGATIVE

## 2017-01-26 LAB — CBC
HCT: 31.5 % — ABNORMAL LOW (ref 35.0–47.0)
HCT: 33.4 % — ABNORMAL LOW (ref 35.0–47.0)
HEMOGLOBIN: 10.9 g/dL — AB (ref 12.0–16.0)
HEMOGLOBIN: 11.2 g/dL — AB (ref 12.0–16.0)
MCH: 30.3 pg (ref 26.0–34.0)
MCH: 29.4 pg (ref 26.0–34.0)
MCHC: 34.6 g/dL (ref 32.0–36.0)
MCHC: 33.6 g/dL (ref 32.0–36.0)
MCV: 87.6 fL (ref 80.0–100.0)
MCV: 87.5 fL (ref 80.0–100.0)
Platelets: 102 10*3/uL — ABNORMAL LOW (ref 150–440)
Platelets: 103 10*3/uL — ABNORMAL LOW (ref 150–440)
RBC: 3.6 MIL/uL — ABNORMAL LOW (ref 3.80–5.20)
RBC: 3.81 MIL/uL (ref 3.80–5.20)
RDW: 13.2 % (ref 11.5–14.5)
RDW: 13.4 % (ref 11.5–14.5)
WBC: 7.9 10*3/uL (ref 3.6–11.0)
WBC: 9.4 10*3/uL (ref 3.6–11.0)

## 2017-01-26 LAB — BASIC METABOLIC PANEL
ANION GAP: 7 (ref 5–15)
BUN: 23 mg/dL — AB (ref 6–20)
CALCIUM: 9 mg/dL (ref 8.9–10.3)
CO2: 28 mmol/L (ref 22–32)
CREATININE: 1.19 mg/dL — AB (ref 0.44–1.00)
Chloride: 97 mmol/L — ABNORMAL LOW (ref 101–111)
GFR calc Af Amer: 49 mL/min — ABNORMAL LOW (ref >60)
GFR, EST NON AFRICAN AMERICAN: 42 mL/min — AB (ref >60)
GLUCOSE: 139 mg/dL — AB (ref 65–99)
Potassium: 4 mmol/L (ref 3.5–5.1)
Sodium: 132 mmol/L — ABNORMAL LOW (ref 135–145)

## 2017-01-26 LAB — PROTIME-INR
INR: 1.47
PROTHROMBIN TIME: 18 s — AB (ref 11.4–15.2)

## 2017-01-26 SURGERY — FIXATION, FEMUR, NECK, PERCUTANEOUS, USING SCREW
Anesthesia: General | Laterality: Left

## 2017-01-26 MED ORDER — MORPHINE SULFATE (PF) 2 MG/ML IV SOLN: 2.0000 mg | INTRAVENOUS | Status: DC | PRN

## 2017-01-26 MED ORDER — CEFAZOLIN SODIUM-DEXTROSE 1-4 GM/50ML-% IV SOLN
1.0000 g | Freq: Four times a day (QID) | INTRAVENOUS | Status: AC
  Administered 2017-01-26 (×2): 1 g via INTRAVENOUS
  Filled 2017-01-26 (×2): qty 50

## 2017-01-26 MED ORDER — ONDANSETRON HCL 4 MG/2ML IJ SOLN
INTRAMUSCULAR | Status: AC
  Filled 2017-01-26: qty 2

## 2017-01-26 MED ORDER — SODIUM CHLORIDE 0.9 % IV SOLN
INTRAVENOUS | Status: DC | PRN
  Administered 2017-01-26: 08:00:00 via INTRAVENOUS

## 2017-01-26 MED ORDER — PHENOL 1.4 % MT LIQD
1.0000 | OROMUCOSAL | Status: DC | PRN
  Filled 2017-01-26: qty 177

## 2017-01-26 MED ORDER — WARFARIN 0.5 MG HALF TABLET
4.5000 mg | ORAL_TABLET | Freq: Every day | ORAL | Status: DC
  Administered 2017-01-26: 4.5 mg via ORAL
  Filled 2017-01-26 (×2): qty 1

## 2017-01-26 MED ORDER — FENTANYL CITRATE (PF) 100 MCG/2ML IJ SOLN
INTRAMUSCULAR | Status: DC | PRN
  Administered 2017-01-26: 25 ug via INTRAVENOUS

## 2017-01-26 MED ORDER — KETAMINE HCL 50 MG/ML IJ SOLN
INTRAMUSCULAR | Status: AC
  Filled 2017-01-26: qty 10

## 2017-01-26 MED ORDER — DOCUSATE SODIUM 100 MG PO CAPS
100.0000 mg | ORAL_CAPSULE | Freq: Two times a day (BID) | ORAL | Status: DC
  Administered 2017-01-26 – 2017-01-28 (×4): 100 mg via ORAL
  Filled 2017-01-26 (×4): qty 1

## 2017-01-26 MED ORDER — EPHEDRINE SULFATE 50 MG/ML IJ SOLN
INTRAMUSCULAR | Status: DC | PRN
  Administered 2017-01-26 (×3): 10 mg via INTRAVENOUS
  Administered 2017-01-26: 15 mg via INTRAVENOUS

## 2017-01-26 MED ORDER — HYDROCODONE-ACETAMINOPHEN 5-325 MG PO TABS
1.0000 | ORAL_TABLET | Freq: Four times a day (QID) | ORAL | Status: DC | PRN
  Administered 2017-01-27 – 2017-01-28 (×3): 1 via ORAL
  Filled 2017-01-26 (×3): qty 1

## 2017-01-26 MED ORDER — GLYCOPYRROLATE 0.2 MG/ML IJ SOLN
INTRAMUSCULAR | Status: AC
  Filled 2017-01-26: qty 1

## 2017-01-26 MED ORDER — ACETAMINOPHEN 10 MG/ML IV SOLN
INTRAVENOUS | Status: DC | PRN
  Administered 2017-01-26: 1000 mg via INTRAVENOUS

## 2017-01-26 MED ORDER — ONDANSETRON HCL 4 MG/2ML IJ SOLN: 4.0000 mg | Freq: Once | INTRAMUSCULAR | Status: DC | PRN

## 2017-01-26 MED ORDER — ACETAMINOPHEN 10 MG/ML IV SOLN
INTRAVENOUS | Status: AC
  Filled 2017-01-26: qty 100

## 2017-01-26 MED ORDER — ACETAMINOPHEN 650 MG RE SUPP: 650.0000 mg | Freq: Four times a day (QID) | RECTAL | Status: DC | PRN

## 2017-01-26 MED ORDER — ONDANSETRON HCL 4 MG PO TABS: 4.0000 mg | ORAL_TABLET | Freq: Four times a day (QID) | ORAL | Status: DC | PRN

## 2017-01-26 MED ORDER — MENTHOL 3 MG MT LOZG
1.0000 | LOZENGE | OROMUCOSAL | Status: DC | PRN
  Filled 2017-01-26: qty 9

## 2017-01-26 MED ORDER — FENTANYL CITRATE (PF) 100 MCG/2ML IJ SOLN
INTRAMUSCULAR | Status: AC
  Filled 2017-01-26: qty 2

## 2017-01-26 MED ORDER — MAGNESIUM CITRATE PO SOLN
1.0000 | Freq: Once | ORAL | Status: DC | PRN
  Filled 2017-01-26: qty 296

## 2017-01-26 MED ORDER — LIDOCAINE HCL (CARDIAC) 20 MG/ML IV SOLN
INTRAVENOUS | Status: DC | PRN
  Administered 2017-01-26: 60 mg via INTRAVENOUS

## 2017-01-26 MED ORDER — ONDANSETRON HCL 4 MG/2ML IJ SOLN: 4.0000 mg | Freq: Four times a day (QID) | INTRAMUSCULAR | Status: DC | PRN

## 2017-01-26 MED ORDER — NEOMYCIN-POLYMYXIN B GU 40-200000 IR SOLN
Status: DC | PRN
  Administered 2017-01-26: 4 mL

## 2017-01-26 MED ORDER — BISACODYL 10 MG RE SUPP: 10.0000 mg | Freq: Every day | RECTAL | Status: DC | PRN

## 2017-01-26 MED ORDER — NEOMYCIN-POLYMYXIN B GU 40-200000 IR SOLN
Status: AC
  Filled 2017-01-26: qty 4

## 2017-01-26 MED ORDER — EPHEDRINE SULFATE 50 MG/ML IJ SOLN
INTRAMUSCULAR | Status: AC
  Filled 2017-01-26: qty 1

## 2017-01-26 MED ORDER — DEXAMETHASONE SODIUM PHOSPHATE 10 MG/ML IJ SOLN
INTRAMUSCULAR | Status: AC
  Filled 2017-01-26: qty 1

## 2017-01-26 MED ORDER — WARFARIN - PHYSICIAN DOSING INPATIENT: Freq: Every day | Status: DC

## 2017-01-26 MED ORDER — METHOCARBAMOL 500 MG PO TABS
500.0000 mg | ORAL_TABLET | Freq: Four times a day (QID) | ORAL | Status: DC | PRN
  Administered 2017-01-26: 500 mg via ORAL
  Filled 2017-01-26: qty 1

## 2017-01-26 MED ORDER — SODIUM CHLORIDE 0.9 % IV SOLN
75.0000 mL/h | INTRAVENOUS | Status: DC
  Administered 2017-01-26: 75 mL/h via INTRAVENOUS

## 2017-01-26 MED ORDER — ACETAMINOPHEN 325 MG PO TABS
650.0000 mg | ORAL_TABLET | Freq: Four times a day (QID) | ORAL | Status: DC | PRN
  Administered 2017-01-26: 650 mg via ORAL
  Filled 2017-01-26 (×2): qty 2

## 2017-01-26 MED ORDER — ONDANSETRON HCL 4 MG/2ML IJ SOLN
INTRAMUSCULAR | Status: DC | PRN
  Administered 2017-01-26: 4 mg via INTRAVENOUS

## 2017-01-26 MED ORDER — METFORMIN HCL 500 MG PO TABS
500.0000 mg | ORAL_TABLET | Freq: Two times a day (BID) | ORAL | Status: DC
  Administered 2017-01-26 – 2017-01-28 (×4): 500 mg via ORAL
  Filled 2017-01-26 (×5): qty 1

## 2017-01-26 MED ORDER — METOPROLOL SUCCINATE ER 25 MG PO TB24
12.5000 mg | ORAL_TABLET | ORAL | Status: DC
  Administered 2017-01-27 – 2017-01-28 (×2): 12.5 mg via ORAL
  Filled 2017-01-26 (×2): qty 1

## 2017-01-26 MED ORDER — FENTANYL CITRATE (PF) 100 MCG/2ML IJ SOLN
25.0000 ug | INTRAMUSCULAR | Status: DC | PRN
  Administered 2017-01-26: 25 ug via INTRAVENOUS

## 2017-01-26 MED ORDER — DEXAMETHASONE SODIUM PHOSPHATE 10 MG/ML IJ SOLN
INTRAMUSCULAR | Status: DC | PRN
  Administered 2017-01-26: 5 mg via INTRAVENOUS

## 2017-01-26 MED ORDER — ALUM & MAG HYDROXIDE-SIMETH 200-200-20 MG/5ML PO SUSP: 30.0000 mL | ORAL | Status: DC | PRN

## 2017-01-26 MED ORDER — PROPOFOL 10 MG/ML IV BOLUS
INTRAVENOUS | Status: DC | PRN
Start: 2017-01-26 — End: 2017-01-26
  Administered 2017-01-26: 100 mg via INTRAVENOUS

## 2017-01-26 MED ORDER — GLYCOPYRROLATE 0.2 MG/ML IJ SOLN
INTRAMUSCULAR | Status: DC | PRN
  Administered 2017-01-26: 0.2 mg via INTRAVENOUS

## 2017-01-26 MED ORDER — SENNA 8.6 MG PO TABS
1.0000 | ORAL_TABLET | Freq: Two times a day (BID) | ORAL | Status: DC
  Administered 2017-01-26 – 2017-01-28 (×4): 8.6 mg via ORAL
  Filled 2017-01-26 (×4): qty 1

## 2017-01-26 MED ORDER — POLYETHYLENE GLYCOL 3350 17 G PO PACK: 17.0000 g | PACK | Freq: Every day | ORAL | Status: DC | PRN

## 2017-01-26 MED ORDER — PROPOFOL 500 MG/50ML IV EMUL
INTRAVENOUS | Status: AC
  Filled 2017-01-26: qty 50

## 2017-01-26 MED ORDER — METHOCARBAMOL 1000 MG/10ML IJ SOLN
500.0000 mg | Freq: Four times a day (QID) | INTRAVENOUS | Status: DC | PRN
  Filled 2017-01-26: qty 5

## 2017-01-26 SURGICAL SUPPLY — 30 items
BIT DRILL CANNULATED 5.0 (BIT) ×3 IMPLANT
BLADE SURG SZ10 CARB STEEL (BLADE) ×3 IMPLANT
BNDG COHESIVE 6X5 TAN STRL LF (GAUZE/BANDAGES/DRESSINGS) ×3 IMPLANT
CANISTER SUCT 1200ML W/VALVE (MISCELLANEOUS) ×3 IMPLANT
CATH TRAY 16F METER LATEX (MISCELLANEOUS) IMPLANT
DRSG OPSITE POSTOP 3X4 (GAUZE/BANDAGES/DRESSINGS) ×3 IMPLANT
DRSG OPSITE POSTOP 4X6 (GAUZE/BANDAGES/DRESSINGS) IMPLANT
DURAPREP 26ML APPLICATOR (WOUND CARE) ×6 IMPLANT
ELECT REM PT RETURN 9FT ADLT (ELECTROSURGICAL) ×3
ELECTRODE REM PT RTRN 9FT ADLT (ELECTROSURGICAL) ×1 IMPLANT
GAUZE SPONGE 4X4 12PLY STRL (GAUZE/BANDAGES/DRESSINGS) IMPLANT
GLOVE BIOGEL PI IND STRL 9 (GLOVE) ×1 IMPLANT
GLOVE BIOGEL PI INDICATOR 9 (GLOVE) ×2
GLOVE SURG 9.0 ORTHO LTXF (GLOVE) ×12 IMPLANT
GOWN STRL REUS TWL 2XL XL LVL4 (GOWN DISPOSABLE) ×3 IMPLANT
GOWN STRL REUS W/ TWL LRG LVL3 (GOWN DISPOSABLE) ×1 IMPLANT
GOWN STRL REUS W/TWL LRG LVL3 (GOWN DISPOSABLE) ×2
GUIDEWIRE THREAD TIP 3.2X9 (WIRE) ×9 IMPLANT
HOLDER FOLEY CATH W/STRAP (MISCELLANEOUS) ×3 IMPLANT
MAT BLUE FLOOR 46X72 FLO (MISCELLANEOUS) ×3 IMPLANT
NS IRRIG 1000ML POUR BTL (IV SOLUTION) ×3 IMPLANT
PACK HIP COMPR (MISCELLANEOUS) ×3 IMPLANT
SCREW CANN 16MM THD 90X7.0 (Screw) ×3 IMPLANT
SCREW CANNULATED 7.0X80 (Screw) ×6 IMPLANT
STAPLER SKIN PROX 35W (STAPLE) ×3 IMPLANT
STRAP SAFETY BODY (MISCELLANEOUS) ×3 IMPLANT
SUT VIC AB 0 CT1 36 (SUTURE) ×3 IMPLANT
SUT VIC AB 2-0 CT2 27 (SUTURE) ×3 IMPLANT
SUT VICRYL 0 AB UR-6 (SUTURE) ×6 IMPLANT
ZIMMER 7.0 SET GUIDEWIRE ×9 IMPLANT

## 2017-01-26 NOTE — Op Note (Signed)
01/25/2017 - 01/26/2017  9:59 AM  PATIENT:  Pamela Hensley    PRE-OPERATIVE DIAGNOSIS:  left valgus impacted femoral neck hip fx  POST-OPERATIVE DIAGNOSIS:  Same  PROCEDURE:  CANNULATED PINNING LEFT HIP  SURGEON:  Thornton Park, MD  ANESTHESIA:   Spinal  IMPLANTS:  Biomet 7.0 cannulated screws x 3  PREOPERATIVE INDICATIONS:  Pamela Hensley is a  82 y.o. female who fell and was found to have a diagnosis of left hip fx who elected for surgical management.    The risks benefits and alternatives were discussed with the patient preoperatively including but not limited to infection, bleeding, nerve or blood vessel injury, persistent hip pain,  malunion, nonunion, avascular necrosis, change in lower extremity rotation, leg length discrepancy, failure of the hardware and the need for revision surgery, including the potential for conversion to hemi or total hip arthroplasty. Medical risks include but are not limited to: DVT and pulmonary embolism, myocardial infarction, stroke, pneumonia, respiratory failure and death.  The patient understood and agreed with the plan for surgery.  Patient had the left hip marked with the word yes and my initials according the hospitals correct site of surgery protocol.  OPERATIVE PROCEDURE: The patient was brought to the operating room and general anesthesia with LMA was was administered by the anesthesia service as the patient's INR was 1.4.  She was then placed supine on the fracture table. IV ancef was administered. The left lower extremity was positioned in a leg holder, without any significant reduction maneuver other than mild internal rotation.  The well leg was placed in hemi-lithotomy position. The hip was prepped and draped in usual sterile fashion.  A time out was performed to verify the patient's name, date of birth, medical record number, correct site of surgery and correct surger to be performed. The  timeout was also used to verify the patient had received  antibiotics that all appropriate instruments, implants and radiographic studies were available in the room. Once all in attendance were in agreement case began..  Once the reduction was deemed near-anatomic, a small lateral incision was made in line with the femur, distal to the greater trochanter, and 3 threaded guidewires were introduced Into the lateral cortex of the femur, across the fracture site and into the humeral head in an inverted triangle configuration. The lengths of these guidepins were measured with a depth gauge. The lateral cortex was then opened with a cannulated drill, and then the cannulated screws were advanced into position and tightened by hand. Solid fixation was achieved.  The guide pins were then removed and final C-arm images were taken of the fracture fixation. The fracture was well reduced and the hardware in good position.  The wound was irrigated copiously, and the deep and subcutaneous tissues were repaired with 2-0 Vicryl suture respectively and the skin was approximated with staples.  I was scrubbed and present the entire case and all sharp and instrument counts were correct at the conclusion of the case.  I spoke with the patient's husband and daughter in the postop consultation room to let them know the case was completed without complication and the patient was stable in the recovery room.    Timoteo Gaul, MD

## 2017-01-26 NOTE — Clinical Social Work Note (Signed)
CSW received consult for possible SNF placement. CSW will follow pending PT recommendations.  Marvon Shillingburg Martha Vanessa Alesi, MSW, LCSWA [phone] 

## 2017-01-26 NOTE — Progress Notes (Signed)
Subjective:  POST-OP CHECK:  Patient is doing well. She has no complaints. Patient reports pain as mild.    Objective:   VITALS:   Vitals:   01/26/17 1443 01/26/17 1502 01/26/17 1602 01/26/17 1703  BP:  101/87 120/73 134/74  Pulse:  70 73 78  Resp:      Temp: (!) 97.3 F (36.3 C)     TempSrc: Oral     SpO2:  97% 98% 98%  Weight:      Height:        PHYSICAL EXAM: Left lower extremity: Neurovascular intact Sensation intact distally Intact pulses distally Dorsiflexion/Plantar flexion intact Incision: dressing C/D/I No cellulitis present Compartment soft  LABS  Results for orders placed or performed during the hospital encounter of 01/25/17 (from the past 24 hour(s))  MRSA PCR Screening     Status: None   Collection Time: 01/25/17 11:07 PM  Result Value Ref Range   MRSA by PCR NEGATIVE NEGATIVE  Basic metabolic panel     Status: Abnormal   Collection Time: 01/26/17  4:45 AM  Result Value Ref Range   Sodium 132 (L) 135 - 145 mmol/L   Potassium 4.0 3.5 - 5.1 mmol/L   Chloride 97 (L) 101 - 111 mmol/L   CO2 28 22 - 32 mmol/L   Glucose, Bld 139 (H) 65 - 99 mg/dL   BUN 23 (H) 6 - 20 mg/dL   Creatinine, Ser 1.19 (H) 0.44 - 1.00 mg/dL   Calcium 9.0 8.9 - 10.3 mg/dL   GFR calc non Af Amer 42 (L) >60 mL/min   GFR calc Af Amer 49 (L) >60 mL/min   Anion gap 7 5 - 15  CBC     Status: Abnormal   Collection Time: 01/26/17  4:45 AM  Result Value Ref Range   WBC 7.9 3.6 - 11.0 K/uL   RBC 3.60 (L) 3.80 - 5.20 MIL/uL   Hemoglobin 10.9 (L) 12.0 - 16.0 g/dL   HCT 31.5 (L) 35.0 - 47.0 %   MCV 87.6 80.0 - 100.0 fL   MCH 30.3 26.0 - 34.0 pg   MCHC 34.6 32.0 - 36.0 g/dL   RDW 13.2 11.5 - 14.5 %   Platelets 102 (L) 150 - 440 K/uL  Protime-INR     Status: Abnormal   Collection Time: 01/26/17  4:45 AM  Result Value Ref Range   Prothrombin Time 18.0 (H) 11.4 - 15.2 seconds   INR 1.47   CBC     Status: Abnormal   Collection Time: 01/26/17 12:33 PM  Result Value Ref Range   WBC  9.4 3.6 - 11.0 K/uL   RBC 3.81 3.80 - 5.20 MIL/uL   Hemoglobin 11.2 (L) 12.0 - 16.0 g/dL   HCT 33.4 (L) 35.0 - 47.0 %   MCV 87.5 80.0 - 100.0 fL   MCH 29.4 26.0 - 34.0 pg   MCHC 33.6 32.0 - 36.0 g/dL   RDW 13.4 11.5 - 14.5 %   Platelets 103 (L) 150 - 440 K/uL  Creatinine, serum     Status: Abnormal   Collection Time: 01/26/17 12:33 PM  Result Value Ref Range   Creatinine, Ser 1.21 (H) 0.44 - 1.00 mg/dL   GFR calc non Af Amer 41 (L) >60 mL/min   GFR calc Af Amer 48 (L) >60 mL/min    Dg Chest 1 View  Result Date: 01/25/2017 CLINICAL DATA:  Fall EXAM: CHEST 1 VIEW COMPARISON:  None. FINDINGS: Lungs are clear.  No pleural effusion  or pneumothorax. The heart is normal in size. IMPRESSION: No evidence of acute cardiopulmonary disease. Electronically Signed   By: Julian Hy M.D.   On: 01/25/2017 17:41   Dg Hip Port Unilat With Pelvis 1v Left  Result Date: 01/26/2017 CLINICAL DATA:  Post LEFT hip surgery EXAM: DG HIP (WITH OR WITHOUT PELVIS) 1V PORT LEFT COMPARISON:  Portable exam 1031 hours compared to 720 1,018 FINDINGS: Diffuse osseous demineralization. Three cannulated screws at proximal LEFT femur across a subcapital fracture of the LEFT femoral neck. Hip joint spaces and SI joints preserved. No additional fracture, dislocation, or bone destruction. Scattered pelvic phleboliths. Lateral soft tissue swelling and skin clips related to surgery. IMPRESSION: Post pinning of the previously identified subcapital fracture of the LEFT femoral neck. Electronically Signed   By: Lavonia Dana M.D.   On: 01/26/2017 10:44   Dg Hip Operative Unilat W Or W/o Pelvis Left  Result Date: 01/26/2017 CLINICAL DATA:  Left hip fracture EXAM: OPERATIVE left HIP (WITH PELVIS IF PERFORMED) 2 VIEWS TECHNIQUE: Fluoroscopic spot image(s) were submitted for interpretation post-operatively. COMPARISON:  Radiography from yesterday FINDINGS: Three cannulated screws were placed across the left femoral neck fracture. The  hip is located. No evidence of intra procedural fracture. IMPRESSION: Fluoroscopy for left femoral neck fracture ORIF. Electronically Signed   By: Monte Fantasia M.D.   On: 01/26/2017 10:12   Dg Hip Unilat W Or Wo Pelvis 2-3 Views Left  Result Date: 01/25/2017 CLINICAL DATA:  Patient tripped over a power cord and fell. EXAM: DG HIP (WITH OR WITHOUT PELVIS) 2-3V LEFT COMPARISON:  None. FINDINGS: Frontal pelvis shows diffuse bony demineralization. The SI joints and symphysis pubis are unremarkable. AP and cross-table lateral views of the left hip show a subcapital to transcervical femoral neck fracture with mild valgus angulation. IMPRESSION: Acute femoral neck fracture with mild valgus deformity. Electronically Signed   By: Misty Stanley M.D.   On: 01/25/2017 17:42    Assessment/Plan: Day of Surgery   Principal Problem:   Closed left hip fracture (HCC) Active Problems:   CAD (coronary artery disease)   Chronic atrial fibrillation (HCC)   Benign essential hypertension   CKD (chronic kidney disease), stage III  I reviewed the postoperative x-ray. The cannulated screws are well positioned. The fracture is well aligned. Patient will complete 24 hours postop antibiotics. She is nonweightbearing on the left lower extremity for a total of 6 weeks postop with a walker. Physical therapy will start tomorrow. Recheck labs in the a.m. Lovenox for DVT prophylaxis to start tomorrow.    Thornton Park , MD 01/26/2017, 7:25 PM

## 2017-01-26 NOTE — Anesthesia Preprocedure Evaluation (Signed)
Anesthesia Evaluation  Patient identified by MRN, date of birth, ID band Patient awake    Reviewed: Allergy & Precautions, NPO status , Patient's Chart, lab work & pertinent test results  History of Anesthesia Complications Negative for: history of anesthetic complications  Airway Mallampati: II       Dental   Pulmonary neg pulmonary ROS,           Cardiovascular Exercise Tolerance: Good hypertension, Pt. on medications + CAD and + Past MI       Neuro/Psych negative neurological ROS     GI/Hepatic negative GI ROS, Neg liver ROS,   Endo/Other  diabetes, Type 2, Oral Hypoglycemic Agents  Renal/GU Renal InsufficiencyRenal disease     Musculoskeletal   Abdominal   Peds  Hematology  (+) anemia ,   Anesthesia Other Findings Past Medical History: No date: Anemia No date: Anxiety No date: Arthritis     Comment:  osteoarthritis No date: Atrial fibrillation (HCC) No date: Cancer [facility])     Comment:  Breast No date: Coronary artery disease No date: Diabetes mellitus without complication (HCC) No date: Hyperlipidemia No date: Hypertension 08/05: Myocardial infarction ([place]) No date: Osteoporosis   Reproductive/Obstetrics                             Anesthesia Physical  Anesthesia Plan  ASA: III  Anesthesia Plan: General   Post-op Pain Management:    Induction: Intravenous  PONV Risk Score and Plan: 4 or greater and Ondansetron, Dexamethasone and Propofol  Airway Management Planned: Simple Face Mask and Natural Airway  Additional Equipment:   Intra-op Plan:   Post-operative Plan:   Informed Consent: I have reviewed the patients History and Physical, chart, labs and discussed the procedure including the risks, benefits and alternatives for the proposed anesthesia with the patient or authorized representative who has indicated his/her understanding and acceptance.     Plan  Discussed with:   Anesthesia Plan Comments:         Anesthesia Quick Evaluation

## 2017-01-26 NOTE — Progress Notes (Signed)
[facility] NAME: Pamela Hensley    MR#:  [id number]  DATE OF BIRTH:  1934/08/23  SUBJECTIVE:  CHIEF COMPLAINT:   Chief Complaint  Patient presents with  . Fall  The patient is 82 year old Caucasian female with past medical history significant for history of anemia, anxiety, arthritis, atrial fibrillation, on Coumadin therapy at home, breast cancer, who presents to the hospital after fall associated left hip fracture. Initial INR was 1.6, operation was performed today. Discussed with Dr. Mack Guise. Patient feels comfortable today, denies any pain at present  Review of Systems  Constitutional: Negative for chills, fever and weight loss.  HENT: Negative for congestion.   Eyes: Negative for blurred vision and double vision.  Respiratory: Negative for cough, sputum production, shortness of breath and wheezing.   Cardiovascular: Negative for chest pain, palpitations, orthopnea, leg swelling and PND.  Gastrointestinal: Negative for abdominal pain, blood in stool, constipation, diarrhea, nausea and vomiting.  Genitourinary: Negative for dysuria, frequency, hematuria and urgency.  Musculoskeletal: Negative for falls.  Neurological: Negative for dizziness, tremors, focal weakness and headaches.  Endo/Heme/Allergies: Does not bruise/bleed easily.  Psychiatric/Behavioral: Negative for depression. The patient does not have insomnia.     VITAL SIGNS: Blood pressure (!) 108/59, pulse 62, temperature 97.6 F (36.4 C), temperature source Oral, resp. rate 18, height 4\' 11"  (1.499 m), weight 51.9 kg (114 lb 8 oz), SpO2 100 %.  PHYSICAL EXAMINATION:   GENERAL:  82 y.o.-year-old patient sitting in the bed with no acute distress.  EYES: Pupils equal, round, reactive to light and accommodation. No scleral icterus. Extraocular muscles intact.  HEENT: Head atraumatic, normocephalic. Oropharynx and nasopharynx clear.  NECK:  Supple, no jugular  venous distention. No thyroid enlargement, no tenderness.  LUNGS: Normal breath sounds bilaterally, no wheezing, rales,rhonchi or crepitation. No use of accessory muscles of respiration.  CARDIOVASCULAR: S1, S2 normal. No murmurs, rubs, or gallops.  ABDOMEN: Soft, nontender, nondistended. Bowel sounds present. No organomegaly or mass.  EXTREMITIES: No pedal edema, cyanosis, or clubbing.  NEUROLOGIC: Cranial nerves II through XII are intact. Muscle strength 5/5 in all extremities but left lower extremity. Sensation intact. Gait not checked.  PSYCHIATRIC: The patient is alert , somewhat confused about medical issues .  SKIN: No obvious rash, lesion, or ulcer.   ORDERS/RESULTS REVIEWED:   CBC  Recent Labs Lab 01/24/17 0920 01/25/17 1745 01/26/17 0445 01/26/17 1233  WBC 7.9 8.0 7.9 9.4  HGB 11.7* 11.7* 10.9* 11.2*  HCT 34.2* 34.6* 31.5* 33.4*  PLT 137* 127* 102* 103*  MCV 87.4 88.3 87.6 87.5  MCH 29.8 29.8 30.3 29.4  MCHC 34.1 33.7 34.6 33.6  RDW 13.5 13.6 13.2 13.4  LYMPHSABS  --  1.0  --   --   MONOABS  --  0.6  --   --   EOSABS  --  0.1  --   --   BASOSABS  --  0.0  --   --    ------------------------------------------------------------------------------------------------------------------  Chemistries   Recent Labs Lab 01/25/17 1745 01/26/17 0445 01/26/17 1233  NA 131* 132*  --   K 4.0 4.0  --   CL 100* 97*  --   CO2 25 28  --   GLUCOSE 137* 139*  --   BUN 29* 23*  --   CREATININE 1.19* 1.19* 1.21*  CALCIUM 9.0 9.0  --    ------------------------------------------------------------------------------------------------------------------ estimated creatinine clearance is 27.3 mL/min (A) (by C-G formula based on SCr  of 1.21 mg/dL (H)). ------------------------------------------------------------------------------------------------------------------ No results for input(s): TSH, T4TOTAL, T3FREE, THYROIDAB in the last 72 hours.  Invalid input(s): FREET3  Cardiac  Enzymes No results for input(s): CKMB, TROPONINI, MYOGLOBIN in the last 168 hours.  Invalid input(s): CK ------------------------------------------------------------------------------------------------------------------ Invalid input(s): POCBNP ---------------------------------------------------------------------------------------------------------------  RADIOLOGY: Dg Chest 1 View  Result Date: 01/25/2017 CLINICAL DATA:  Fall EXAM: CHEST 1 VIEW COMPARISON:  None. FINDINGS: Lungs are clear.  No pleural effusion or pneumothorax. The heart is normal in size. IMPRESSION: No evidence of acute cardiopulmonary disease. Electronically Signed   By: Julian Hy M.D.   On: 01/25/2017 17:41   Dg Hip Port Unilat With Pelvis 1v Left  Result Date: 01/26/2017 CLINICAL DATA:  Post LEFT hip surgery EXAM: DG HIP (WITH OR WITHOUT PELVIS) 1V PORT LEFT COMPARISON:  Portable exam 1031 hours compared to 720 1,018 FINDINGS: Diffuse osseous demineralization. Three cannulated screws at proximal LEFT femur across a subcapital fracture of the LEFT femoral neck. Hip joint spaces and SI joints preserved. No additional fracture, dislocation, or bone destruction. Scattered pelvic phleboliths. Lateral soft tissue swelling and skin clips related to surgery. IMPRESSION: Post pinning of the previously identified subcapital fracture of the LEFT femoral neck. Electronically Signed   By: Lavonia Dana M.D.   On: 01/26/2017 10:44   Dg Hip Operative Unilat W Or W/o Pelvis Left  Result Date: 01/26/2017 CLINICAL DATA:  Left hip fracture EXAM: OPERATIVE left HIP (WITH PELVIS IF PERFORMED) 2 VIEWS TECHNIQUE: Fluoroscopic spot image(s) were submitted for interpretation post-operatively. COMPARISON:  Radiography from yesterday FINDINGS: Three cannulated screws were placed across the left femoral neck fracture. The hip is located. No evidence of intra procedural fracture. IMPRESSION: Fluoroscopy for left femoral neck fracture ORIF.  Electronically Signed   By: Monte Fantasia M.D.   On: 01/26/2017 10:12   Dg Hip Unilat W Or Wo Pelvis 2-3 Views Left  Result Date: 01/25/2017 CLINICAL DATA:  Patient tripped over a power cord and fell. EXAM: DG HIP (WITH OR WITHOUT PELVIS) 2-3V LEFT COMPARISON:  None. FINDINGS: Frontal pelvis shows diffuse bony demineralization. The SI joints and symphysis pubis are unremarkable. AP and cross-table lateral views of the left hip show a subcapital to transcervical femoral neck fracture with mild valgus angulation. IMPRESSION: Acute femoral neck fracture with mild valgus deformity. Electronically Signed   By: Misty Stanley M.D.   On: 01/25/2017 17:42    EKG:  Orders placed or performed during the hospital encounter of 01/25/17  . ED EKG  . ED EKG    ASSESSMENT AND PLAN:  Principal Problem:   Closed left hip fracture (HCC) Active Problems:   CAD (coronary artery disease)   Chronic atrial fibrillation (HCC)   Benign essential hypertension   CKD (chronic kidney disease), stage III  #1. Closed left hip fracture, status post cannulated pinning of left hip 01/26/2017 by Dr. Mack Guise, continue pain management, physical therapy as tolerated, likely skilled nursing facility placement for rehabilitation within the next few days. Discussed with Dr. Mack Guise today #2. Acute posthemorrhagic anemia, no need to transfuse, hemoglobin level remains stable #3 thrombocytopenia, relatively stable, follow closely, likely consumption #4. Hyponatremia, follow with rehydration #5. Chronic renal insufficiency, CKD stage III, seems to be stable #6. Coagulopathy due to Coumadin, resume Coumadin therapy as allowed by orthopedic surgeon, likely tomorrow #7. Bradycardia, decrease metoprolol dose  Management plans discussed with the patient, family and they are in agreement.   DRUG ALLERGIES: No Known Allergies  CODE STATUS:     Code  Status Orders        Start     Ordered   01/25/17 2225  Full code   Continuous     01/25/17 2224    Code Status History    Date Active Date Inactive Code Status Order ID Comments User Context   This patient has a current code status but no historical code status.      TOTAL TIME TAKING CARE OF THIS PATIENT: 40 minutes.    Theodoro Grist M.D on 01/26/2017 at 2:02 PM  Between 7am to 6pm - Pager - [phone]  After 6pm go to [email or web address] - password [email or web address] [facility]  Office  [phone]  CC: Primary care physician; Rusty Aus, MD

## 2017-01-26 NOTE — Anesthesia Postprocedure Evaluation (Signed)
Anesthesia Post Note  Patient: Pamela Hensley  Procedure(s) Performed: Procedure(s) (LRB): CANNULATED HIP PINNING (Left)  Patient location during evaluation: PACU Anesthesia Type: General Level of consciousness: awake and alert Pain management: pain level controlled Vital Signs Assessment: post-procedure vital signs reviewed and stable Respiratory status: spontaneous breathing, nonlabored ventilation, respiratory function stable and patient connected to nasal cannula oxygen Cardiovascular status: blood pressure returned to baseline and stable Postop Assessment: no signs of nausea or vomiting Anesthetic complications: no     Last Vitals:  Vitals:   01/26/17 1156 01/26/17 1210  BP: 114/73 (!) 108/59  Pulse: 62 62  Resp: 18 18  Temp: 36.4 C 36.4 C    Last Pain:  Vitals:   01/26/17 1210  TempSrc: Oral  PainSc:                  Martha Clan

## 2017-01-26 NOTE — Progress Notes (Signed)
Pt is alert and oriented. Scheduled for surgery in the morning. Consent signed. Medicated for pain with good results.

## 2017-01-26 NOTE — Transfer of Care (Signed)
Immediate Anesthesia Transfer of Care Note  Patient: Pamela Hensley  Procedure(s) Performed: Procedure(s): CANNULATED HIP PINNING (Left)  Patient Location: PACU  Anesthesia Type:General  Level of Consciousness: sedated  Airway & Oxygen Therapy: Patient Spontanous Breathing and Patient connected to face mask oxygen  Post-op Assessment: Report given to RN and Post -op Vital signs reviewed and stable  Post vital signs: Reviewed and stable  Last Vitals:  Vitals:   01/26/17 0706 01/26/17 0955  BP: (!) 103/56 126/89  Pulse: (!) 59 78  Resp: 18 17  Temp: 36.4 C (!) (P) 36.3 C    Last Pain:  Vitals:   01/26/17 0706  TempSrc: Oral  PainSc: 1          Complications: No apparent anesthesia complications

## 2017-01-26 NOTE — Anesthesia Procedure Notes (Signed)
Procedure Name: LMA Insertion Date/Time: 01/26/2017 8:36 AM Performed by: Dionne Bucy Pre-anesthesia Checklist: Patient identified, Patient being monitored, Timeout performed, Emergency Drugs available and Suction available Patient Re-evaluated:Patient Re-evaluated prior to induction Oxygen Delivery Method: Circle system utilized Preoxygenation: Pre-oxygenation with 100% oxygen Induction Type: IV induction Ventilation: Mask ventilation without difficulty LMA: LMA inserted LMA Size: 3.5 Tube type: Oral Number of attempts: 1 Placement Confirmation: positive ETCO2 and breath sounds checked- equal and bilateral Tube secured with: Tape Dental Injury: Teeth and Oropharynx as per pre-operative assessment

## 2017-01-26 NOTE — Anesthesia Post-op Follow-up Note (Cosign Needed)
Anesthesia QCDR form completed.        

## 2017-01-27 LAB — BASIC METABOLIC PANEL
Anion gap: 9 (ref 5–15)
BUN: 22 mg/dL — AB (ref 6–20)
CALCIUM: 8.5 mg/dL — AB (ref 8.9–10.3)
CHLORIDE: 100 mmol/L — AB (ref 101–111)
CO2: 23 mmol/L (ref 22–32)
CREATININE: 1.17 mg/dL — AB (ref 0.44–1.00)
GFR, EST AFRICAN AMERICAN: 50 mL/min — AB (ref >60)
GFR, EST NON AFRICAN AMERICAN: 43 mL/min — AB (ref >60)
Glucose, Bld: 167 mg/dL — ABNORMAL HIGH (ref 65–99)
Potassium: 3.9 mmol/L (ref 3.5–5.1)
SODIUM: 132 mmol/L — AB (ref 135–145)

## 2017-01-27 LAB — CBC
HCT: 30.1 % — ABNORMAL LOW (ref 35.0–47.0)
HEMOGLOBIN: 10.2 g/dL — AB (ref 12.0–16.0)
MCH: 29.8 pg (ref 26.0–34.0)
MCHC: 34 g/dL (ref 32.0–36.0)
MCV: 87.7 fL (ref 80.0–100.0)
PLATELETS: 103 10*3/uL — AB (ref 150–440)
RBC: 3.43 MIL/uL — ABNORMAL LOW (ref 3.80–5.20)
RDW: 13.2 % (ref 11.5–14.5)
WBC: 11.7 10*3/uL — ABNORMAL HIGH (ref 3.6–11.0)

## 2017-01-27 MED ORDER — WARFARIN SODIUM 4 MG PO TABS: 4.5000 mg | ORAL_TABLET | Freq: Every day | ORAL | Status: DC

## 2017-01-27 MED ORDER — WARFARIN SODIUM 6 MG PO TABS
6.5000 mg | ORAL_TABLET | Freq: Once | ORAL | Status: AC
  Administered 2017-01-27: 18:00:00 6.5 mg via ORAL
  Filled 2017-01-27: qty 1

## 2017-01-27 NOTE — [facility] (Signed)
ANTICOAGULATION CONSULT NOTE - Initial Consult  Pharmacy Consult for warfarin Indication: atrial fibrillation  No Known Allergies  Patient Measurements: Height: 4\' 11"  (149.9 cm) Weight: 114 lb 8 oz (51.9 kg) IBW/kg (Calculated) : 43.2 Heparin Dosing Weight:   Vital Signs: Temp: 97.9 F (36.6 C) (07/22 1312) Temp Source: Oral (07/22 1312) BP: 121/63 (07/22 1312) Pulse Rate: 61 (07/22 1312)  Labs:  Recent Labs  01/25/17 1745 01/26/17 0445 01/26/17 1233 01/27/17 0426  HGB 11.7* 10.9* 11.2* 10.2*  HCT 34.6* 31.5* 33.4* 30.1*  PLT 127* 102* 103* 103*  APTT 27  --   --   --   LABPROT 19.3* 18.0*  --   --   INR 1.61 1.47  --   --   CREATININE 1.19* 1.19* 1.21* 1.17*    Estimated Creatinine Clearance: 28.3 mL/min (A) (by C-G formula based on SCr of 1.17 mg/dL (H)).   Medical History: Past Medical History:  Diagnosis Date  . Anemia   . Anxiety   . Arthritis    osteoarthritis  . Atrial fibrillation ([place])   . Cancer (HCC)    Breast  . Coronary artery disease   . Diabetes mellitus without complication ([place])   . Hyperlipidemia   . Hypertension   . Myocardial infarction ([place]) 08/05  . Osteoporosis     Medications:  Scheduled:  . aspirin EC  81 mg Oral Daily  . digoxin  0.125 mg Oral Daily  . diltiazem  120 mg Oral Daily  . docusate sodium  100 mg Oral BID  . metFORMIN  500 mg Oral BID WC  . metoprolol succinate  12.5 mg Oral BH-q7a  . senna  1 tablet Oral BID  . simvastatin  40 mg Oral q1800  . sotalol  80 mg Oral BID  . venlafaxine  75 mg Oral Daily  . [START ON 01/28/2017] warfarin  4.5 mg Oral q1800  . warfarin  6.5 mg Oral ONCE-1800  . Warfarin - Physician Dosing Inpatient   Does not apply q1800    Assessment: Patient is a 82 year old female w/ a PMH of afib on warfarin at home who is post op day 1 hip surgery. Pt received 1mg  IV of vit K last night. She also received 4.5mg  of warfarin last night after surgery. Pt home dose is 4.5mg  daily. INR on  admission was 1.6. INR this AM is 1.47.  Goal of Therapy:  INR 2-3 Monitor platelets by anticoagulation protocol: Yes   Plan:  Will give 6.5mg  tonight to help overcome effects of Vit K and since pt was subtherapeutic on admission. Resume 4.5mg  daily tomorrow. Spoke with Dr. Mack Guise to clarify if pt should be bridged with either therapeutic or prophylatic lovenox since pt is subtherapeutic. Per Dr. Mack Guise no bridging needed, just continue warfarin. Will recheck INR/CBC in the AM.   Ramond Dial, Pharm.D, BCPS Clinical Pharmacist  01/27/2017,2:34 PM

## 2017-01-27 NOTE — Progress Notes (Signed)
Subjective:  Postoperative day #1 status post cannulated screw fixation for right femoral neck hip fracture. Patient reports pain as mild.  Patient's grand children are at the bedside. Patient denies any significant pain.  She has no other complaints. Patient states she was able to get out of bed today with physical therapy.  Objective:   VITALS:   Vitals:   01/26/17 2006 01/26/17 2312 01/27/17 0706 01/27/17 1312  BP: 124/66 115/63 (!) 113/56 121/63  Pulse: (!) 56 71 68 61  Resp: 19 19 18    Temp: 98 F (36.7 C) 98.6 F (37 C) 98 F (36.7 C) 97.9 F (36.6 C)  TempSrc: Oral  Oral Oral  SpO2: 98% 96% 97% 96%  Weight:      Height:        PHYSICAL EXAM: Left lower extremity: Neurovascular intact Sensation intact distally Intact pulses distally Dorsiflexion/Plantar flexion intact Incision: no drainage No cellulitis present Compartment soft  LABS  Results for orders placed or performed during the hospital encounter of 01/25/17 (from the past 24 hour(s))  CBC     Status: Abnormal   Collection Time: 01/27/17  4:26 AM  Result Value Ref Range   WBC 11.7 (H) 3.6 - 11.0 K/uL   RBC 3.43 (L) 3.80 - 5.20 MIL/uL   Hemoglobin 10.2 (L) 12.0 - 16.0 g/dL   HCT 30.1 (L) 35.0 - 47.0 %   MCV 87.7 80.0 - 100.0 fL   MCH 29.8 26.0 - 34.0 pg   MCHC 34.0 32.0 - 36.0 g/dL   RDW 13.2 11.5 - 14.5 %   Platelets 103 (L) 150 - 440 K/uL  Basic metabolic panel     Status: Abnormal   Collection Time: 01/27/17  4:26 AM  Result Value Ref Range   Sodium 132 (L) 135 - 145 mmol/L   Potassium 3.9 3.5 - 5.1 mmol/L   Chloride 100 (L) 101 - 111 mmol/L   CO2 23 22 - 32 mmol/L   Glucose, Bld 167 (H) 65 - 99 mg/dL   BUN 22 (H) 6 - 20 mg/dL   Creatinine, Ser 1.17 (H) 0.44 - 1.00 mg/dL   Calcium 8.5 (L) 8.9 - 10.3 mg/dL   GFR calc non Af Amer 43 (L) >60 mL/min   GFR calc Af Amer 50 (L) >60 mL/min   Anion gap 9 5 - 15    Dg Chest 1 View  Result Date: 01/25/2017 CLINICAL DATA:  Fall EXAM: CHEST 1 VIEW  COMPARISON:  None. FINDINGS: Lungs are clear.  No pleural effusion or pneumothorax. The heart is normal in size. IMPRESSION: No evidence of acute cardiopulmonary disease. Electronically Signed   By: Julian Hy M.D.   On: 01/25/2017 17:41   Dg Hip Port Unilat With Pelvis 1v Left  Result Date: 01/26/2017 CLINICAL DATA:  Post LEFT hip surgery EXAM: DG HIP (WITH OR WITHOUT PELVIS) 1V PORT LEFT COMPARISON:  Portable exam 1031 hours compared to 720 1,018 FINDINGS: Diffuse osseous demineralization. Three cannulated screws at proximal LEFT femur across a subcapital fracture of the LEFT femoral neck. Hip joint spaces and SI joints preserved. No additional fracture, dislocation, or bone destruction. Scattered pelvic phleboliths. Lateral soft tissue swelling and skin clips related to surgery. IMPRESSION: Post pinning of the previously identified subcapital fracture of the LEFT femoral neck. Electronically Signed   By: Lavonia Dana M.D.   On: 01/26/2017 10:44   Dg Hip Operative Unilat W Or W/o Pelvis Left  Result Date: 01/26/2017 CLINICAL DATA:  Left hip fracture  EXAM: OPERATIVE left HIP (WITH PELVIS IF PERFORMED) 2 VIEWS TECHNIQUE: Fluoroscopic spot image(s) were submitted for interpretation post-operatively. COMPARISON:  Radiography from yesterday FINDINGS: Three cannulated screws were placed across the left femoral neck fracture. The hip is located. No evidence of intra procedural fracture. IMPRESSION: Fluoroscopy for left femoral neck fracture ORIF. Electronically Signed   By: Monte Fantasia M.D.   On: 01/26/2017 10:12   Dg Hip Unilat W Or Wo Pelvis 2-3 Views Left  Result Date: 01/25/2017 CLINICAL DATA:  Patient tripped over a power cord and fell. EXAM: DG HIP (WITH OR WITHOUT PELVIS) 2-3V LEFT COMPARISON:  None. FINDINGS: Frontal pelvis shows diffuse bony demineralization. The SI joints and symphysis pubis are unremarkable. AP and cross-table lateral views of the left hip show a subcapital to  transcervical femoral neck fracture with mild valgus angulation. IMPRESSION: Acute femoral neck fracture with mild valgus deformity. Electronically Signed   By: Misty Stanley M.D.   On: 01/25/2017 17:42    Assessment/Plan: 1 Day Post-Op   Principal Problem:   Closed left hip fracture (HCC) Active Problems:   CAD (coronary artery disease)   Chronic atrial fibrillation (HCC)   Benign essential hypertension   CKD (chronic kidney disease), stage III  Patient doing well postop.  Patient making expected progress with physical therapy. Patient is nonweightbearing on the left lower extremity for 6 weeks postop. She will start Lovenox for DVT prophylaxis today. Encourage incentive spirometry while awake. Patient's Foley catheter has been removed. She has completed 24 hours of postop antibiotics.    Thornton Park , MD 01/27/2017, 1:59 PM

## 2017-01-27 NOTE — Evaluation (Signed)
Physical Therapy Evaluation Patient Details Name: Pamela Hensley MRN: [id number] DOB: September 01, 1934 Today's Date: 01/27/2017   History of Present Illness  Pt is a 82 y/o F who presented s/p fall with subsequent L hip fx.  Pt is now s/p pinning L hip.  Pt's PMH includes breast cancer, MI, osteoporosis, a-fib.    Clinical Impression  Patient is s/p above surgery resulting in functional limitations due to the deficits listed below (see PT Problem List). Ms. Minella was agreeable to therapy and performed stand pivot transfer x2 with min assist to remain steady and frequent cues for NWB as pt initially demonstration TDWB.  Pt with +emesis after first pivot to [facility], RN made aware. Given pt's current mobility status, recommending SNF at d/c.  Patient will benefit from skilled PT to increase their independence and safety with mobility to allow discharge to the venue listed below.      Follow Up Recommendations SNF    Equipment Recommendations  3in1 (PT)    Recommendations for Other Services OT consult     Precautions / Restrictions Precautions Precautions: Fall;Other (comment) Precaution Comments: Has experienced some bradycardia while in hospital Restrictions Weight Bearing Restrictions: Yes LLE Weight Bearing: Non weight bearing (for 6 weeks)      Mobility  Bed Mobility Overal bed mobility: Needs Assistance Bed Mobility: Supine to Sit     Supine to sit: Min guard;HOB elevated     General bed mobility comments: Increased time and effort with use of bed rail.  Transfers Overall transfer level: Needs assistance Equipment used: Rolling walker (2 wheeled) Transfers: Sit to/from [facility] Sit to Stand: Min assist Stand pivot transfers: Min assist       General transfer comment: Min assist to steady with sit<>stand.  Cues for proper hand placement and safe technique using RW.  Pt pivots to [facility] and again to chair.  When pivoting to [facility] pt intermittently demonstrates TDWB  and is immediately instructed to lift foot from floor.  On second pivot to chair pt able to adhere to NWB precautions.  Cues to reach back for armrests when preparing to sit.  Pt controls descent to chair well.  Ambulation/Gait             General Gait Details: Deferred as concern for non-adherence to [facility] precautions  Stairs            Wheelchair Mobility    Modified Rankin (Stroke Patients Only)       Balance Overall balance assessment: Needs assistance;History of Falls Sitting-balance support: No upper extremity supported;Feet supported Sitting balance-Leahy Scale: Good     Standing balance support: Bilateral upper extremity supported;During functional activity Standing balance-Leahy Scale: Poor Standing balance comment: Relies on UE support for static and dynamic activities                             Pertinent Vitals/Pain Pain Assessment: No/denies pain    Home Living Family/patient expects to be discharged to:: Private residence Living Arrangements: Spouse/significant other Available Help at Discharge: Family;Available 24 hours/day Type of Home: House Home Access: Stairs to enter Entrance Stairs-Rails: None Entrance Stairs-Number of Steps: 1 Home Layout: One level Home Equipment: Walker - 2 wheels      Prior Function Level of Independence: Independent               Hand Dominance        Extremity/Trunk Assessment   Upper Extremity Assessment  Upper Extremity Assessment: Overall WFL for tasks assessed    Lower Extremity Assessment Lower Extremity Assessment: RLE deficits/detail RLE Deficits / Details: Strength grossly 3+/5.         Communication   Communication: No difficulties  Cognition Arousal/Alertness: Awake/alert Behavior During Therapy: WFL for tasks assessed/performed Overall Cognitive Status: Within Functional Limits for tasks assessed                                        General Comments  General comments (skin integrity, edema, etc.): Pt with BM, notified RN.  Pt with +emesis after pivot to [facility], RN made aware.    Exercises General Exercises - Lower Extremity Ankle Circles/Pumps: AROM;Left;5 reps;Supine;Other (comment) (limited repetitions as pt requests to get to [facility])   Assessment/Plan    PT Assessment Patient needs continued PT services  PT Problem List Decreased strength;Decreased activity tolerance;Decreased balance;Decreased mobility;Decreased knowledge of use of DME;Decreased safety awareness;Decreased knowledge of precautions       PT Treatment Interventions DME instruction;Gait training;Stair training;Functional mobility training;Therapeutic activities;Therapeutic exercise;Balance training;Neuromuscular re-education;Patient/family education;Modalities    PT Goals (Current goals can be found in the Care Plan section)  Acute Rehab PT Goals Patient Stated Goal: to get stronger PT Goal Formulation: With patient Time For Goal Achievement: 02/10/17 Potential to Achieve Goals: Good    Frequency BID   Barriers to discharge        Co-evaluation               AM-PAC PT "6 Clicks" Daily Activity  Outcome Measure Difficulty turning over in bed (including adjusting bedclothes, sheets and blankets)?: None Difficulty moving from lying on back to sitting on the side of the bed? : Total Difficulty sitting down on and standing up from a chair with arms (e.g., wheelchair, bedside commode, etc,.)?: Total Help needed moving to and from a bed to chair (including a wheelchair)?: A Little Help needed walking in hospital room?: A Little Help needed climbing 3-5 steps with a railing? : Total 6 Click Score: 13    End of Session Equipment Utilized During Treatment: Gait belt Activity Tolerance: Patient tolerated treatment well Patient left: in chair;with call bell/phone within reach;with chair alarm set Nurse Communication: Mobility status;Weight bearing status;Other  (comment) (pt with +BM and +emesis) PT Visit Diagnosis: Unsteadiness on feet (R26.81);Other abnormalities of gait and mobility (R26.89);History of falling (Z91.81)    Time: 3295-1884 PT Time Calculation (min) (ACUTE ONLY): 34 min   Charges:   PT Evaluation $PT Eval Low Complexity: 1 Procedure PT Treatments $Therapeutic Activity: 8-22 mins   PT G Codes:        Collie Siad PT, DPT 01/27/2017, 10:21 AM

## 2017-01-27 NOTE — Progress Notes (Signed)
Correction to my last note.  Patient has restarted her coumadin and will not be on lovenox for DVT prophylaxis.

## 2017-01-27 NOTE — Progress Notes (Signed)
[place] [facility] [place] NAME: Pamela Hensley    MR#:  [id number]  DATE OF BIRTH:  30-Sep-1934  SUBJECTIVE:  CHIEF COMPLAINT:   Chief Complaint  Patient presents with  . Fall  The patient is 82 year old Caucasian female with past medical history significant for history of anemia, anxiety, arthritis, atrial fibrillation, on Coumadin therapy at home, breast cancer, who presents to the hospital after fall associated left hip fracture. Initial INR was 1.6, operation was performed 01/26/2017. Discussed with Dr. Mack Guise earlier today, patient may be able to go to rehabilitation tomorrow, physical therapist recommended skilled nursing facility placement. Patient feels comfortable today, denies significant pain on exertion. She is nonweightbearing to left lower extremity for the next 6 weeks  Review of Systems  Constitutional: Negative for chills, fever and weight loss.  HENT: Negative for congestion.   Eyes: Negative for blurred vision and double vision.  Respiratory: Negative for cough, sputum production, shortness of breath and wheezing.   Cardiovascular: Negative for chest pain, palpitations, orthopnea, leg swelling and PND.  Gastrointestinal: Negative for abdominal pain, blood in stool, constipation, diarrhea, nausea and vomiting.  Genitourinary: Negative for dysuria, frequency, hematuria and urgency.  Musculoskeletal: Negative for falls.  Neurological: Negative for dizziness, tremors, focal weakness and headaches.  Endo/Heme/Allergies: Does not bruise/bleed easily.  Psychiatric/Behavioral: Negative for depression. The patient does not have insomnia.     VITAL SIGNS: Blood pressure 121/63, pulse 61, temperature 97.9 F (36.6 C), temperature source Oral, resp. rate 18, height 4\' 11"  (1.499 m), weight 51.9 kg (114 lb 8 oz), SpO2 96 %.  PHYSICAL EXAMINATION:   GENERAL:  82 y.o.-year-old patient sitting on the bedside commode in no acute  distress.  EYES: Pupils equal, round, reactive to light and accommodation. No scleral icterus. Extraocular muscles intact.  HEENT: Head atraumatic, normocephalic. Oropharynx and nasopharynx clear.  NECK:  Supple, no jugular venous distention. No thyroid enlargement, no tenderness.  LUNGS: Normal breath sounds bilaterally, no wheezing, rales,rhonchi or crepitation. No use of accessory muscles of respiration.  CARDIOVASCULAR: S1, S2 normal. No murmurs, rubs, or gallops.  ABDOMEN: Soft, nontender, nondistended. Bowel sounds present. No organomegaly or mass.  EXTREMITIES: No pedal edema, cyanosis, or clubbing.  NEUROLOGIC: Cranial nerves II through XII are intact. Muscle strength 5/5 in all extremities but left lower extremity. Sensation intact. Gait not checked.  PSYCHIATRIC: The patient is alert , somewhat confused about medical issues .  SKIN: No obvious rash, lesion, or ulcer.   ORDERS/RESULTS REVIEWED:   CBC  Recent Labs Lab 01/24/17 0920 01/25/17 1745 01/26/17 0445 01/26/17 1233 01/27/17 0426  WBC 7.9 8.0 7.9 9.4 11.7*  HGB 11.7* 11.7* 10.9* 11.2* 10.2*  HCT 34.2* 34.6* 31.5* 33.4* 30.1*  PLT 137* 127* 102* 103* 103*  MCV 87.4 88.3 87.6 87.5 87.7  MCH 29.8 29.8 30.3 29.4 29.8  MCHC 34.1 33.7 34.6 33.6 34.0  RDW 13.5 13.6 13.2 13.4 13.2  LYMPHSABS  --  1.0  --   --   --   MONOABS  --  0.6  --   --   --   EOSABS  --  0.1  --   --   --   BASOSABS  --  0.0  --   --   --    ------------------------------------------------------------------------------------------------------------------  Chemistries   Recent Labs Lab 01/25/17 1745 01/26/17 0445 01/26/17 1233 01/27/17 0426  NA 131* 132*  --  132*  K 4.0 4.0  --  3.9  CL 100* 97*  --  100*  CO2 25 28  --  23  GLUCOSE 137* 139*  --  167*  BUN 29* 23*  --  22*  CREATININE 1.19* 1.19* 1.21* 1.17*  CALCIUM 9.0 9.0  --  8.5*    ------------------------------------------------------------------------------------------------------------------ estimated creatinine clearance is 28.3 mL/min (A) (by C-G formula based on SCr of 1.17 mg/dL (H)). ------------------------------------------------------------------------------------------------------------------ No results for input(s): TSH, T4TOTAL, T3FREE, THYROIDAB in the last 72 hours.  Invalid input(s): FREET3  Cardiac Enzymes No results for input(s): CKMB, TROPONINI, MYOGLOBIN in the last 168 hours.  Invalid input(s): CK ------------------------------------------------------------------------------------------------------------------ Invalid input(s): POCBNP ---------------------------------------------------------------------------------------------------------------  RADIOLOGY: Dg Chest 1 View  Result Date: 01/25/2017 CLINICAL DATA:  Fall EXAM: CHEST 1 VIEW COMPARISON:  None. FINDINGS: Lungs are clear.  No pleural effusion or pneumothorax. The heart is normal in size. IMPRESSION: No evidence of acute cardiopulmonary disease. Electronically Signed   By: Julian Hy M.D.   On: 01/25/2017 17:41   Dg Hip Port Unilat With Pelvis 1v Left  Result Date: 01/26/2017 CLINICAL DATA:  Post LEFT hip surgery EXAM: DG HIP (WITH OR WITHOUT PELVIS) 1V PORT LEFT COMPARISON:  Portable exam 1031 hours compared to 720 1,018 FINDINGS: Diffuse osseous demineralization. Three cannulated screws at proximal LEFT femur across a subcapital fracture of the LEFT femoral neck. Hip joint spaces and SI joints preserved. No additional fracture, dislocation, or bone destruction. Scattered pelvic phleboliths. Lateral soft tissue swelling and skin clips related to surgery. IMPRESSION: Post pinning of the previously identified subcapital fracture of the LEFT femoral neck. Electronically Signed   By: Lavonia Dana M.D.   On: 01/26/2017 10:44   Dg Hip Operative Unilat W Or W/o Pelvis Left  Result  Date: 01/26/2017 CLINICAL DATA:  Left hip fracture EXAM: OPERATIVE left HIP (WITH PELVIS IF PERFORMED) 2 VIEWS TECHNIQUE: Fluoroscopic spot image(s) were submitted for interpretation post-operatively. COMPARISON:  Radiography from yesterday FINDINGS: Three cannulated screws were placed across the left femoral neck fracture. The hip is located. No evidence of intra procedural fracture. IMPRESSION: Fluoroscopy for left femoral neck fracture ORIF. Electronically Signed   By: Monte Fantasia M.D.   On: 01/26/2017 10:12   Dg Hip Unilat W Or Wo Pelvis 2-3 Views Left  Result Date: 01/25/2017 CLINICAL DATA:  Patient tripped over a power cord and fell. EXAM: DG HIP (WITH OR WITHOUT PELVIS) 2-3V LEFT COMPARISON:  None. FINDINGS: Frontal pelvis shows diffuse bony demineralization. The SI joints and symphysis pubis are unremarkable. AP and cross-table lateral views of the left hip show a subcapital to transcervical femoral neck fracture with mild valgus angulation. IMPRESSION: Acute femoral neck fracture with mild valgus deformity. Electronically Signed   By: Misty Stanley M.D.   On: 01/25/2017 17:42    EKG:  Orders placed or performed during the hospital encounter of 01/25/17  . ED EKG  . ED EKG    ASSESSMENT AND PLAN:  Principal Problem:   Closed left hip fracture (HCC) Active Problems:   CAD (coronary artery disease)   Chronic atrial fibrillation (HCC)   Benign essential hypertension   CKD (chronic kidney disease), stage III  #1. Closed left hip fracture, status post cannulated pinning of left hip 01/26/2017 by Dr. Mack Guise, continue pain management, Pain control is adequate, physical therapist recommended skilled nursing facility for rehabilitation, likely discharge tomorrow . Discussed with Dr. Mack Guise today. Dr. Mack Guise is initiating Lovenox for DVT prophylaxis today, restart Coumadin #2. Acute posthemorrhagic anemia, no need to transfuse, hemoglobin level remains stable #  3 thrombocytopenia,  relatively stable, following in a.m. , chronic worsened by consumption post fracture #4. Hyponatremia, stable, now off IV fluids #5. Chronic renal insufficiency, CKD stage III,  stable #6. Coagulopathy due to Coumadin, resume Coumadin therapy today, follow pro time INR in the morning #7. Bradycardia, now on decreased metoprolol dose  Management plans discussed with the patient, family and they are in agreement.   DRUG ALLERGIES: No Known Allergies  CODE STATUS:     Code Status Orders        Start     Ordered   01/25/17 2225  Full code  Continuous     01/25/17 2224    Code Status History    Date Active Date Inactive Code Status Order ID Comments User Context   This patient has a current code status but no historical code status.      TOTAL TIME TAKING CARE OF THIS PATIENT: 30 minutes.    Theodoro Grist M.D on 01/27/2017 at 2:08 PM  Between 7am to 6pm - Pager - [phone]  After 6pm go to [email or web address] - password [email or web address] [place] [facility]  Office  [phone]  CC: Primary care physician; Rusty Aus, MD

## 2017-01-27 NOTE — NC FL2 (Signed)
[place] LEVEL OF CARE SCREENING TOOL     IDENTIFICATION  Patient Name: Pamela Hensley Birthdate: 1934/11/20 Sex: female Admission Date (Current Location): 01/25/2017  [place] and [place] Number:  Engineering geologist and Address:  [facility], [address] [place], [place] [address]      Provider Number: [id number]  Attending Physician Name and Address:  Theodoro Grist, MD  Relative Name and Phone Number:       Current Level of Care: Hospital Recommended Level of Care: [place] Prior Approval Number:    Date Approved/Denied:   PASRR Number: [id number]  Discharge Plan: SNF    Current Diagnoses: Patient Active Problem List   Diagnosis Date Noted  . Closed left hip fracture ([place]) 01/25/2017  . CKD (chronic kidney disease), stage III 11/30/2016  . Benign essential hypertension 11/13/2016  . Primary cancer of lower-outer quadrant of right breast ([place]) 11/11/2016  . Malignant neoplasm of lower-outer quadrant of right female breast ([place]) 10/25/2016  . Age-related osteoporosis without current pathological fracture 08/27/2016  . Chronic atrial fibrillation ([place]) 08/27/2016  . Medicare annual wellness visit, initial 08/27/2016  . Hyperlipidemia, mixed 02/23/2016  . Controlled type 2 diabetes mellitus without complication, without long-term current use of insulin ([place]) 08/24/2015  . CAD (coronary artery disease) 11/11/2013  . OA (osteoarthritis) 11/11/2013    Orientation RESPIRATION BLADDER Height & Weight     Self, Time, Situation  Normal Continent Weight: 114 lb 8 oz (51.9 kg) Height:  4\' 11"  (149.9 cm)  BEHAVIORAL SYMPTOMS/MOOD NEUROLOGICAL BOWEL NUTRITION STATUS      Continent    AMBULATORY STATUS COMMUNICATION OF NEEDS Skin   Extensive Assist Verbally Surgical wounds                       Personal Care Assistance Level of Assistance  Bathing, Feeding, Dressing Bathing Assistance: Maximum  assistance Feeding assistance: Independent Dressing Assistance: Maximum assistance     Functional Limitations Info             SPECIAL CARE FACTORS FREQUENCY  PT (By licensed PT)     PT Frequency: Up to 5 X per day, 5 days per week              Contractures      Additional Factors Info  Code Status, Allergies Code Status Info: Full Allergies Info: NKA           Current Medications (01/27/2017):  This is the current hospital active medication list Current Facility-Administered Medications  Medication Dose Route Frequency Provider Last Rate Last Dose  . 0.9 %  sodium chloride infusion  75 mL/hr Intravenous Continuous Thornton Park, MD   Stopped at 01/27/17 0115  . acetaminophen (TYLENOL) tablet 650 mg  650 mg Oral Q6H PRN Thornton Park, MD   650 mg at 01/26/17 2043   Or  . acetaminophen (TYLENOL) suppository 650 mg  650 mg Rectal Q6H PRN Thornton Park, MD      . alum & mag hydroxide-simeth (MAALOX/MYLANTA) 200-200-20 MG/5ML suspension 30 mL  30 mL Oral Q4H PRN Thornton Park, MD      . aspirin EC tablet 81 mg  81 mg Oral Daily Lance Coon, MD   81 mg at 01/27/17 0809  . bisacodyl (DULCOLAX) suppository 10 mg  10 mg Rectal Daily PRN Thornton Park, MD      . digoxin [facility]) tablet 0.125 mg  0.125 mg Oral Daily Lance Coon, MD  0.125 mg at 01/27/17 0809  . diltiazem (CARDIZEM) tablet 120 mg  120 mg Oral Daily Lance Coon, MD   120 mg at 01/27/17 0809  . docusate sodium (COLACE) capsule 100 mg  100 mg Oral BID Thornton Park, MD   100 mg at 01/27/17 1610  . HYDROcodone-acetaminophen (NORCO/VICODIN) 5-325 MG per tablet 1-2 tablet  1-2 tablet Oral Q6H PRN Thornton Park, MD   1 tablet at 01/27/17 0810  . magnesium citrate solution 1 Bottle  1 Bottle Oral Once PRN Thornton Park, MD      . menthol-cetylpyridinium (CEPACOL) lozenge 3 mg  1 lozenge Oral PRN Thornton Park, MD       Or  . phenol (CHLORASEPTIC) mouth spray 1 spray  1 spray  Mouth/Throat PRN Thornton Park, MD      . metFORMIN (GLUCOPHAGE) tablet 500 mg  500 mg Oral BID WC Thornton Park, MD   500 mg at 01/27/17 1750  . methocarbamol (ROBAXIN) tablet 500 mg  500 mg Oral Q6H PRN Thornton Park, MD   500 mg at 01/26/17 1304   Or  . methocarbamol (ROBAXIN) 500 mg in dextrose 5 % 50 mL IVPB  500 mg Intravenous Q6H PRN Thornton Park, MD      . metoprolol succinate (TOPROL-XL) 24 hr tablet 12.5 mg  12.5 mg Oral Sondra Come, Mariabelen Pressly, MD   12.5 mg at 01/27/17 0700  . morphine 2 MG/ML injection 2 mg  2 mg Intravenous Q2H PRN Thornton Park, MD      . ondansetron [facility]) tablet 4 mg  4 mg Oral Q6H PRN Thornton Park, MD       Or  . ondansetron [facility]) injection 4 mg  4 mg Intravenous Q6H PRN Thornton Park, MD      . polyethylene glycol (MIRALAX / GLYCOLAX) packet 17 g  17 g Oral Daily PRN Thornton Park, MD      . senna (SENOKOT) tablet 8.6 mg  1 tablet Oral BID Thornton Park, MD   8.6 mg at 01/27/17 0810  . simvastatin (ZOCOR) tablet 40 mg  40 mg Oral q1800 Lance Coon, MD   40 mg at 01/27/17 1749  . sotalol (BETAPACE) tablet 80 mg  80 mg Oral BID Lance Coon, MD   80 mg at 01/27/17 0810  . venlafaxine (EFFEXOR) tablet 75 mg  75 mg Oral Daily Lance Coon, MD   75 mg at 01/27/17 0809  . [START ON 01/28/2017] warfarin (COUMADIN) tablet 4.5 mg  4.5 mg Oral q1800 Ramond Dial, RPH      . Warfarin - Physician Dosing Inpatient   Does not apply [id number] Theodoro Grist, MD         Discharge Medications: Please see discharge summary for a list of discharge medications.  Relevant Imaging Results:  Relevant Lab Results:   Additional Information SS# [id number]  Zettie Pho, LCSW

## 2017-01-28 ENCOUNTER — Ambulatory Visit: Payer: Medicare Other

## 2017-01-28 ENCOUNTER — Encounter: Payer: Self-pay | Admitting: Orthopedic Surgery

## 2017-01-28 DIAGNOSIS — R001 Bradycardia, unspecified: Secondary | ICD-10-CM

## 2017-01-28 DIAGNOSIS — D696 Thrombocytopenia, unspecified: Secondary | ICD-10-CM

## 2017-01-28 DIAGNOSIS — E871 Hypo-osmolality and hyponatremia: Secondary | ICD-10-CM

## 2017-01-28 DIAGNOSIS — D649 Anemia, unspecified: Secondary | ICD-10-CM

## 2017-01-28 LAB — BASIC METABOLIC PANEL
ANION GAP: 8 (ref 5–15)
BUN: 28 mg/dL — AB (ref 6–20)
CALCIUM: 9 mg/dL (ref 8.9–10.3)
CO2: 27 mmol/L (ref 22–32)
Chloride: 103 mmol/L (ref 101–111)
Creatinine, Ser: 1.1 mg/dL — ABNORMAL HIGH (ref 0.44–1.00)
GFR calc Af Amer: 53 mL/min — ABNORMAL LOW (ref >60)
GFR, EST NON AFRICAN AMERICAN: 46 mL/min — AB (ref >60)
GLUCOSE: 165 mg/dL — AB (ref 65–99)
POTASSIUM: 4 mmol/L (ref 3.5–5.1)
SODIUM: 138 mmol/L (ref 135–145)

## 2017-01-28 LAB — PROTIME-INR
INR: 1.13
PROTHROMBIN TIME: 14.6 s (ref 11.4–15.2)

## 2017-01-28 LAB — CBC
HCT: 29.1 % — ABNORMAL LOW (ref 35.0–47.0)
Hemoglobin: 10.1 g/dL — ABNORMAL LOW (ref 12.0–16.0)
MCH: 30.4 pg (ref 26.0–34.0)
MCHC: 34.7 g/dL (ref 32.0–36.0)
MCV: 87.6 fL (ref 80.0–100.0)
PLATELETS: 103 10*3/uL — AB (ref 150–440)
RBC: 3.33 MIL/uL — AB (ref 3.80–5.20)
RDW: 13.5 % (ref 11.5–14.5)
WBC: 8.3 10*3/uL (ref 3.6–11.0)

## 2017-01-28 MED ORDER — SENNA 8.6 MG PO TABS: 1.0000 | ORAL_TABLET | Freq: Two times a day (BID) | ORAL | 0 refills | Status: DC

## 2017-01-28 MED ORDER — WARFARIN SODIUM 7.5 MG PO TABS
7.5000 mg | ORAL_TABLET | Freq: Once | ORAL | Status: DC
  Filled 2017-01-28: qty 1

## 2017-01-28 MED ORDER — DOCUSATE SODIUM 100 MG PO CAPS: 100.0000 mg | ORAL_CAPSULE | Freq: Two times a day (BID) | ORAL | 0 refills | Status: DC

## 2017-01-28 MED ORDER — METOPROLOL SUCCINATE ER 25 MG PO TB24: 12.5000 mg | ORAL_TABLET | ORAL | 3 refills | Status: DC

## 2017-01-28 MED ORDER — POLYETHYLENE GLYCOL 3350 17 G PO PACK: 17.0000 g | PACK | Freq: Every day | ORAL | 0 refills | Status: DC | PRN

## 2017-01-28 MED ORDER — HYDROCODONE-ACETAMINOPHEN 5-325 MG PO TABS: 1.0000 | ORAL_TABLET | Freq: Four times a day (QID) | ORAL | 0 refills | Status: DC | PRN

## 2017-01-28 MED ORDER — METHOCARBAMOL 500 MG PO TABS: 500.0000 mg | ORAL_TABLET | Freq: Four times a day (QID) | ORAL | 0 refills | Status: DC | PRN

## 2017-01-28 NOTE — Care Management Note (Signed)
Case Management Note  Patient Details  Name: Pamela Hensley MRN: [id number] Date of Birth: 01/17/1935  Subjective/Objective: RNCM consult for discharge planning. Spoke with PT and they are recommending home health.  Met with patient. She lives with her husband who is her caregiver. He will transport her home by car. Offered choice of home health agencies. [facility]erral to [facility] for [facility] health PT, RN and OT. Ordered a rolling walker and BSC  from [facility]. PCP is Dr. Sabra Heck.                  Action/Plan: [facility] for PT, RN and OT. DME ordered from [facility].   Expected Discharge Date:  01/28/17               Expected Discharge Plan:  [place]  In-House Referral:     Discharge planning Services  CM Consult  Post Acute Care Choice:  Durable Medical Equipment, Home Health Choice offered to:  Patient  DME Arranged:  Walker rolling , [facility] DME Agency:  [place] Arranged:  PT, RN, OT [facility] Agency:  [place]  Status of Service:  Completed, signed off  If discussed at [place] of Stay Meetings, dates discussed:    Additional Comments:  Jolly Mango, RN 01/28/2017, 11:36 AM

## 2017-01-28 NOTE — Progress Notes (Signed)
  Subjective:  Postoperative day #2 status post percutaneous fixation for left femoral neck hip fracture. Patient reports pain as mild.  Patient tolerated by mouth diet. Her family is at the bedside. Patient is no complaints. She is due to be discharged today.  Objective:   VITALS:   Vitals:   01/27/17 1312 01/27/17 2127 01/27/17 2313 01/28/17 0753  BP: 121/63 (!) 117/58 127/62 (!) 145/74  Pulse: 61 71 75 74  Resp:    16  Temp: 97.9 F (36.6 C) 98.3 F (36.8 C) 98.5 F (36.9 C) 99 F (37.2 C)  TempSrc: Oral Oral  Oral  SpO2: 96% 96% 99% 99%  Weight:      Height:        PHYSICAL EXAM: Left lower extremity: Neurovascular intact Sensation intact distally Intact pulses distally Dorsiflexion/Plantar flexion intact Incision: dressing C/D/I No cellulitis present Compartment soft  LABS  Results for orders placed or performed during the hospital encounter of 01/25/17 (from the past 24 hour(s))  CBC     Status: Abnormal   Collection Time: 01/28/17  4:23 AM  Result Value Ref Range   WBC 8.3 3.6 - 11.0 K/uL   RBC 3.33 (L) 3.80 - 5.20 MIL/uL   Hemoglobin 10.1 (L) 12.0 - 16.0 g/dL   HCT 29.1 (L) 35.0 - 47.0 %   MCV 87.6 80.0 - 100.0 fL   MCH 30.4 26.0 - 34.0 pg   MCHC 34.7 32.0 - 36.0 g/dL   RDW 13.5 11.5 - 14.5 %   Platelets 103 (L) 150 - 440 K/uL  Basic metabolic panel     Status: Abnormal   Collection Time: 01/28/17  4:23 AM  Result Value Ref Range   Sodium 138 135 - 145 mmol/L   Potassium 4.0 3.5 - 5.1 mmol/L   Chloride 103 101 - 111 mmol/L   CO2 27 22 - 32 mmol/L   Glucose, Bld 165 (H) 65 - 99 mg/dL   BUN 28 (H) 6 - 20 mg/dL   Creatinine, Ser 1.10 (H) 0.44 - 1.00 mg/dL   Calcium 9.0 8.9 - 10.3 mg/dL   GFR calc non Af Amer 46 (L) >60 mL/min   GFR calc Af Amer 53 (L) >60 mL/min   Anion gap 8 5 - 15  Protime-INR     Status: None   Collection Time: 01/28/17  4:23 AM  Result Value Ref Range   Prothrombin Time 14.6 11.4 - 15.2 seconds   INR 1.13     No results  found.  Assessment/Plan: 2 Days Post-Op   Principal Problem:   Closed left hip fracture (HCC) Active Problems:   CAD (coronary artery disease)   Chronic atrial fibrillation (HCC)   Benign essential hypertension   CKD (chronic kidney disease), stage III   Anemia   Thrombocytopenia (HCC)   Hyponatremia   Bradycardia  Patient doing well from orthopedic standpoint. She will remain nonweightbearing on the left lower extremity 6 weeks postop. Patient is on Coumadin at baseline which will provide her DVT prophylaxis. He'll follow-up with me in 10 days in the office for staple removal and reevaluation.    Thornton Park , MD 01/28/2017, 1:31 PM

## 2017-01-28 NOTE — Care Management Important Message (Signed)
Important Message  Patient Details  Name: TALAJAH SLIMP MRN: [id number] Date of Birth: 1935-04-19   Medicare Important Message Given:  Yes    Jolly Mango, RN 01/28/2017, 11:46 AM

## 2017-01-28 NOTE — Progress Notes (Signed)
Invasive mammary carcinoma of her right breast. She is on treatment hold since her surgery. We'll try to resume treatments this Wednesday as an outpatient or if she goes to rehabilitation transfer for rehabilitation. I have talked to the patient personally she is anxious to restart.

## 2017-01-28 NOTE — Progress Notes (Addendum)
ANTICOAGULATION CONSULT NOTE - Follow Up Consult  Pharmacy Consult for warfarin dosing  Indication: atrial fibrillation   No Known Allergies  Patient Measurements: Height: 4\' 11"  (149.9 cm) Weight: 114 lb 8 oz (51.9 kg) IBW/kg (Calculated) : 43.2 Heparin Dosing Weight:  Vital Signs: Temp: 99 F (37.2 C) (07/23 0753) Temp Source: Oral (07/23 0753) BP: 145/74 (07/23 0753) Pulse Rate: 74 (07/23 0753)  Labs:  Recent Labs  01/25/17 1745 01/26/17 0445 01/26/17 1233 01/27/17 0426 01/28/17 0423  HGB 11.7* 10.9* 11.2* 10.2* 10.1*  HCT 34.6* 31.5* 33.4* 30.1* 29.1*  PLT 127* 102* 103* 103* 103*  APTT 27  --   --   --   --   LABPROT 19.3* 18.0*  --   --  14.6  INR 1.61 1.47  --   --  1.13  CREATININE 1.19* 1.19* 1.21* 1.17* 1.10*    Estimated Creatinine Clearance: 30.1 mL/min (A) (by C-G formula based on SCr of 1.1 mg/dL (H)).   Medications:  Scheduled:  . aspirin EC  81 mg Oral Daily  . digoxin  0.125 mg Oral Daily  . diltiazem  120 mg Oral Daily  . docusate sodium  100 mg Oral BID  . metFORMIN  500 mg Oral BID WC  . metoprolol succinate  12.5 mg Oral BH-q7a  . senna  1 tablet Oral BID  . simvastatin  40 mg Oral q1800  . sotalol  80 mg Oral BID  . venlafaxine  75 mg Oral Daily  . warfarin  4.5 mg Oral q1800  . Warfarin - Physician Dosing Inpatient   Does not apply q1800   Infusions:  . sodium chloride Stopped (01/27/17 0115)  . methocarbamol (ROBAXIN)  IV     PRN: acetaminophen **OR** acetaminophen, alum & mag hydroxide-simeth, bisacodyl, HYDROcodone-acetaminophen, magnesium citrate, menthol-cetylpyridinium **OR** phenol, methocarbamol **OR** methocarbamol (ROBAXIN)  IV, morphine injection, ondansetron **OR** ondansetron (ZOFRAN) IV, polyethylene glycol  Assessment: 82 year old female w/ a PMH of afib on warfarin, post op day 2 of hip surgery. Patient received 1mg  vitamin K IV 7/21. Home dose is 4.5mg , INR upon admission 1.6, INR this morning 1.13.   Goal of  Therapy:  INR 2-3 Monitor platelets by anticoagulation protocol: Yes   Plan:  Will give warfarin 7.5mg  tonight due to subtherapeutic INR and potential vitamin K resistance. Will continue to monitor and adjust to therapeutic INR, checked daily, noting home dose of 4.5mg  daily.    Donna Christen Satia Winger 01/28/2017,8:11 AM

## 2017-01-28 NOTE — Discharge Summary (Signed)
[place] [facility] [place] NAME: Pamela Hensley    MR#:  [id number]  DATE OF BIRTH:  Aug 25, 1934  DATE OF ADMISSION:  01/25/2017 ADMITTING PHYSICIAN: Lance Coon, MD  DATE OF DISCHARGE: No discharge date for patient encounter.  PRIMARY CARE PHYSICIAN: Rusty Aus, MD     ADMISSION DIAGNOSIS:  SOB (shortness of breath) [R06.02] Closed fracture of left hip, initial encounter ([place]) [S72.002A] Fall, initial encounter [W19.XXXA]  DISCHARGE DIAGNOSIS:  Principal Problem:   Closed left hip fracture (HCC) Active Problems:   CAD (coronary artery disease)   Chronic atrial fibrillation (HCC)   Benign essential hypertension   CKD (chronic kidney disease), stage III   Anemia   Thrombocytopenia (HCC)   Hyponatremia   Bradycardia   SECONDARY DIAGNOSIS:   Past Medical History:  Diagnosis Date  . Anemia   . Anxiety   . Arthritis    osteoarthritis  . Atrial fibrillation ([place])   . Cancer (HCC)    Breast  . Coronary artery disease   . Diabetes mellitus without complication ([place])   . Hyperlipidemia   . Hypertension   . Myocardial infarction ([place]) 08/05  . Osteoporosis     .pro HOSPITAL COURSE:   The patient is 82 year old Caucasian female with past medical history significant for history of anemia, anxiety, arthritis, atrial fibrillation, on Coumadin therapy at home, breast cancer, who presents to the hospital after fall associated left hip fracture. Initial INR was 1.6, operation was performed 01/26/2017. Physical therapist recommended skilled nursing facility placement for rehabilitation Patient feels comfortable today, denies significant pain with movements. She is nonweightbearing to left lower extremity for the next 6 weeks. Patient was followed for mild anemia while in the hospital, hemoglobin level remained stable and it was 10.1 on the day of discharge. Repeat SGOT was also found to be low, but stable during her stay in the  hospital time. It was felt to be chronic. Kidney function remained stable, and creatinine was 1.1 on the day of discharge. Patient was noted to be in atrial fibrillation slow ventricular response, her Toprol-XL dose was decreased to 12.5 mg daily. Her heart rate remained stable in 60s to 70s with this. Metoprolol dose. Patient was felt to be stable to be discharged to skilled nursing facility for rehabilitation  today. Discussion by problem: #1. Closed left hip fracture, status post cannulated pinning of left hip 01/26/2017 by Dr. Mack Guise, continue pain management, Pain control is adequate, physical therapist recommended skilled nursing facility for rehabilitation. Dr. Mack Guise recommended to restart Coumadin for DVT prophylaxis. Patient's PT/INR were 14.5/1.13. On the day of discharge. It is recommended to follow patient's PT/INR closely until it reaches therapeutic levels.  #2. Anemia, minimal decline of hemoglobin postoperatively from 11.7-10.1, no need to transfuse, hemoglobin level remains stable, follow as outpatient and initiate iron supplementation if needed #3 thrombocytopenia, relatively stable, follow as outpatient  , chronic,  worsened by consumption post fracture #4. Hyponatremia, resolved #5. Chronic renal insufficiency, CKD stage III,  stable #6. Coagulopathy due to Coumadin, resumed Coumadin therapy 01/27/2017, follow pro time INR as outpatient, PT/INR were 14.5/1.13 on the day of discharge, 01/28/2017 #7. Atrial fibrillation with slow ventricular response , now on decreased metoprolol dose, follow digoxin level, continue Coumadin therapy, INR 1.13 today, follow level closely, adjust Coumadin dose according to the need  DISCHARGE CONDITIONS:   Stable  CONSULTS OBTAINED:  Treatment Team:  Thornton Park, MD  DRUG ALLERGIES:  No Known  Allergies  DISCHARGE MEDICATIONS:   Current Discharge Medication List    START taking these medications   Details  docusate sodium  (COLACE) 100 MG capsule Take 1 capsule (100 mg total) by mouth 2 (two) times daily. Qty: 10 capsule, Refills: 0    HYDROcodone-acetaminophen (NORCO/VICODIN) 5-325 MG tablet Take 1-2 tablets by mouth every 6 (six) hours as needed for moderate pain. Qty: 30 tablet, Refills: 0    methocarbamol (ROBAXIN) 500 MG tablet Take 1 tablet (500 mg total) by mouth every 6 (six) hours as needed for muscle spasms. Qty: 60 tablet, Refills: 0    polyethylene glycol (MIRALAX / GLYCOLAX) packet Take 17 g by mouth daily as needed for mild constipation. Qty: 14 each, Refills: 0    senna (SENOKOT) 8.6 MG TABS tablet Take 1 tablet (8.6 mg total) by mouth 2 (two) times daily. Qty: 120 each, Refills: 0      CONTINUE these medications which have CHANGED   Details  metoprolol succinate (TOPROL-XL) 25 MG 24 hr tablet Take 0.5 tablets (12.5 mg total) by mouth every morning. Qty: 30 tablet, Refills: 3      CONTINUE these medications which have NOT CHANGED   Details  acetaminophen (TYLENOL) 325 MG tablet Take 325 mg by mouth every 6 (six) hours as needed for mild pain or headache.    aspirin 81 MG tablet Take 81 mg by mouth daily.    cetirizine (ZYRTEC) 10 MG tablet Take 10 mg by mouth daily as needed for allergies.     Cholecalciferol (VITAMIN D PO) Take 1 tablet by mouth daily.    digoxin (LANOXIN) 0.125 MG tablet Take 0.125 mg by mouth daily. AM    diltiazem (CARDIZEM) 120 MG tablet Take 120 mg by mouth daily. AM    glimepiride (AMARYL) 2 MG tablet TAKE (1) TABLET BY MOUTH DAILY FOR DIABETES    hydrochlorothiazide (HYDRODIURIL) 25 MG tablet Take 25 mg by mouth daily as needed (swelling).     MAGNESIUM PO Take 250 mg by mouth every morning.     metFORMIN (GLUCOPHAGE) 500 MG tablet Take 500 mg by mouth 2 (two) times daily with a meal.     simvastatin (ZOCOR) 40 MG tablet Take 40 mg by mouth daily at 6 PM. PM     sotalol (BETAPACE) 80 MG tablet Take 80 mg by mouth 2 (two) times daily. AM       venlafaxine (EFFEXOR) 75 MG tablet Take 75 mg by mouth daily. AM    warfarin (COUMADIN) 3 MG tablet Take 4.5 mg by mouth daily. PM         DISCHARGE INSTRUCTIONS:    The patient is to follow-up with primary care physician and orthopedic surgeon, Dr. Mack Guise as outpatient  If you experience worsening of your admission symptoms, develop shortness of breath, life threatening emergency, suicidal or homicidal thoughts you must seek medical attention immediately by calling 911 or calling your MD immediately  if symptoms less severe.  You Must read complete instructions/literature along with all the possible adverse reactions/side effects for all the Medicines you take and that have been prescribed to you. Take any new Medicines after you have completely understood and accept all the possible adverse reactions/side effects.   Please note  You were cared for by a hospitalist during your hospital stay. If you have any questions about your discharge medications or the care you received while you were in the hospital after you are discharged, you can call the unit and asked  to speak with the hospitalist on call if the hospitalist that took care of you is not available. Once you are discharged, your primary care physician will handle any further medical issues. Please note that NO REFILLS for any discharge medications will be authorized once you are discharged, as it is imperative that you return to your primary care physician (or establish a relationship with a primary care physician if you do not have one) for your aftercare needs so that they can reassess your need for medications and monitor your lab values.    Today   CHIEF COMPLAINT:   Chief Complaint  Patient presents with  . Fall    HISTORY OF PRESENT ILLNESS:      VITAL SIGNS:  Blood pressure (!) 145/74, pulse 74, temperature 99 F (37.2 C), temperature source Oral, resp. rate 16, height 4\' 11"  (1.499 m), weight 51.9 kg (114 lb  8 oz), SpO2 99 %.  I/O:   Intake/Output Summary (Last 24 hours) at 01/28/17 0923 Last data filed at 01/27/17 1852  Gross per 24 hour  Intake              360 ml  Output                0 ml  Net              360 ml    PHYSICAL EXAMINATION:  GENERAL:  82 y.o.-year-old patient lying in the bed with no acute distress.  EYES: Pupils equal, round, reactive to light and accommodation. No scleral icterus. Extraocular muscles intact.  HEENT: Head atraumatic, normocephalic. Oropharynx and nasopharynx clear.  NECK:  Supple, no jugular venous distention. No thyroid enlargement, no tenderness.  LUNGS: Normal breath sounds bilaterally, no wheezing, rales,rhonchi or crepitation. No use of accessory muscles of respiration.  CARDIOVASCULAR: S1, S2 normal. No murmurs, rubs, or gallops.  ABDOMEN: Soft, non-tender, non-distended. Bowel sounds present. No organomegaly or mass.  EXTREMITIES: No pedal edema, cyanosis, or clubbing.  NEUROLOGIC: Cranial nerves II through XII are intact. Muscle strength 5/5 in all extremities. Sensation intact. Gait not checked.  PSYCHIATRIC: The patient is alert and oriented x 3.  SKIN: No obvious rash, lesion, or ulcer.   DATA REVIEW:   CBC  Recent Labs Lab 01/28/17 0423  WBC 8.3  HGB 10.1*  HCT 29.1*  PLT 103*    Chemistries   Recent Labs Lab 01/28/17 0423  NA 138  K 4.0  CL 103  CO2 27  GLUCOSE 165*  BUN 28*  CREATININE 1.10*  CALCIUM 9.0    Cardiac Enzymes No results for input(s): TROPONINI in the last 168 hours.  Microbiology Results  Results for orders placed or performed during the hospital encounter of 01/25/17  MRSA PCR Screening     Status: None   Collection Time: 01/25/17 11:07 PM  Result Value Ref Range Status   MRSA by PCR NEGATIVE NEGATIVE Final    Comment:        The GeneXpert MRSA Assay (FDA approved for NASAL specimens only), is one component of a comprehensive MRSA colonization surveillance program. It is not intended to  diagnose MRSA infection nor to guide or monitor treatment for MRSA infections.     RADIOLOGY:  Dg Hip Port Unilat With Pelvis 1v Left  Result Date: 01/26/2017 CLINICAL DATA:  Post LEFT hip surgery EXAM: DG HIP (WITH OR WITHOUT PELVIS) 1V PORT LEFT COMPARISON:  Portable exam 1031 hours compared to 720 1,018 FINDINGS: Diffuse osseous demineralization. Three  cannulated screws at proximal LEFT femur across a subcapital fracture of the LEFT femoral neck. Hip joint spaces and SI joints preserved. No additional fracture, dislocation, or bone destruction. Scattered pelvic phleboliths. Lateral soft tissue swelling and skin clips related to surgery. IMPRESSION: Post pinning of the previously identified subcapital fracture of the LEFT femoral neck. Electronically Signed   By: Lavonia Dana M.D.   On: 01/26/2017 10:44   Dg Hip Operative Unilat W Or W/o Pelvis Left  Result Date: 01/26/2017 CLINICAL DATA:  Left hip fracture EXAM: OPERATIVE left HIP (WITH PELVIS IF PERFORMED) 2 VIEWS TECHNIQUE: Fluoroscopic spot image(s) were submitted for interpretation post-operatively. COMPARISON:  Radiography from yesterday FINDINGS: Three cannulated screws were placed across the left femoral neck fracture. The hip is located. No evidence of intra procedural fracture. IMPRESSION: Fluoroscopy for left femoral neck fracture ORIF. Electronically Signed   By: Monte Fantasia M.D.   On: 01/26/2017 10:12    EKG:   Orders placed or performed during the hospital encounter of 01/25/17  . ED EKG  . ED EKG      Management plans discussed with the patient, family and they are in agreement.  CODE STATUS:     Code Status Orders        Start     Ordered   01/25/17 2225  Full code  Continuous     01/25/17 2224    Code Status History    Date Active Date Inactive Code Status Order ID Comments User Context   This patient has a current code status but no historical code status.      TOTAL TIME TAKING CARE OF THIS  PATIENT: 40 minutes.    Theodoro Grist M.D on 01/28/2017 at 9:23 AM  Between 7am to 6pm - Pager - [phone]  After 6pm go to [email or web address] - password [email or web address] [place] [facility]  Office  [phone]  CC: Primary care physician; Rusty Aus, MD

## 2017-01-28 NOTE — Progress Notes (Signed)
Dr. Ether Griffins asked this writer to page Dr. Mack Guise regarding Lovenox. No new orders at this time from Dr. Raliegh Ip, patient currently on coumadin. Dr. Clayton Bibles is aware.

## 2017-01-28 NOTE — Progress Notes (Signed)
PT is recommending home health. Clinical Education officer, museum (CSW) met with patient in the hall way and made her aware of above. Patient stated that she will discharge home today and her husband will pick her up. RN case Freight forwarder and RN aware of above. Please reconsult if future social work needs arise. CSW signing off.   [employer], LCSW [phone]

## 2017-01-28 NOTE — Progress Notes (Signed)
Physical Therapy Treatment Patient Details Name: Pamela Hensley MRN: [id number] DOB: 05-Feb-1935 Today's Date: 01/28/2017    History of Present Illness Pt is a 82 y/o F who presented s/p fall with subsequent L hip fx.  Pt is now s/p pinning L hip.  Pt's PMH includes breast cancer, MI, osteoporosis, a-fib.    PT Comments    Pamela Hensley made excellent progress with mobility today.  She ambulated 70 ft with RW demonstrating NWB LLE.  She initially requires max verbal cues for safety with sit>stand transfers, able to perform later in session with min verbal cues.  Family and pt confirm that husband physically able to assist pt if needed at d/c and will be available 24/7.  Given pt's improvement with mobility, follow up recommendations have been updated, RN, CM, and SW updated.    Follow Up Recommendations  Home health PT;Supervision/Assistance - 24 hour     Equipment Recommendations  3in1 (PT);Rolling walker with 5" wheels    Recommendations for Other Services       Precautions / Restrictions Precautions Precautions: Fall Restrictions Weight Bearing Restrictions: Yes LLE Weight Bearing: Non weight bearing (for 6 weeks)    Mobility  Bed Mobility Overal bed mobility: Needs Assistance Bed Mobility: Supine to Sit;Sit to Supine     Supine to sit: Supervision Sit to supine: Supervision   General bed mobility comments: Bed flat with no rails to simulate home environment.  Pt requries increased time and effort.  Pt performs well using leg hook technique.  Transfers Overall transfer level: Needs assistance Equipment used: Rolling walker (2 wheeled) Transfers: Sit to/from Stand Sit to Stand: Min guard         General transfer comment: Pt requries max verbal cues for initial sit>stand regarding hand placement and safe technique using RW.  On second attempt sit>stand she demonstrates proper hand placement but has toes resting on the floor.  Cues provided for pt to maintain NWB LLE.  To  sit pt demonstrates proper safe technique without cues needed.  Ambulation/Gait Ambulation/Gait assistance: Min guard Ambulation Distance (Feet): 70 Feet Assistive device: Rolling walker (2 wheeled) Gait Pattern/deviations:  (hop to) Gait velocity: decreased Gait velocity interpretation: Below normal speed for age/gender General Gait Details: Min verbal cues for NWB LLE but pt does a good job adhering while ambulating.  Pt ambulates ~70 ft before fatiguing.     Stairs Stairs:  (see general comments)          Wheelchair Mobility    Modified Rankin (Stroke Patients Only)       Balance Overall balance assessment: Needs assistance;History of Falls Sitting-balance support: No upper extremity supported;Feet supported Sitting balance-Leahy Scale: Good     Standing balance support: Bilateral upper extremity supported;During functional activity Standing balance-Leahy Scale: Poor Standing balance comment: Relies on UE support for static and dynamic activities                            Cognition Arousal/Alertness: Awake/alert Behavior During Therapy: WFL for tasks assessed/performed Overall Cognitive Status: Within Functional Limits for tasks assessed                                        Exercises General Exercises - Lower Extremity Ankle Circles/Pumps: AROM;Supine;Both;10 reps Long Arc Quad: AROM;Left;10 reps;Seated Heel Slides: AROM;Left;10 reps;Supine Hip ABduction/ADduction: AROM;Left;10 reps;Supine Straight Leg Raises:  AROM;AAROM;Left;10 reps;Supine    General Comments General comments (skin integrity, edema, etc.): Explained to pt and pt's cousin proper technique using chair inside home on top of step when entering home.  Pt is to back up to step and then sit in chair.  Husband will then turn the chair around so pt can ambulate into the home.  Pt and cousin both verbalize understanding.      Pertinent Vitals/Pain Pain Assessment:  No/denies pain    Home Living                      Prior Function            PT Goals (current goals can now be found in the care plan section) Acute Rehab PT Goals Patient Stated Goal: to get stronger PT Goal Formulation: With patient Time For Goal Achievement: 02/10/17 Potential to Achieve Goals: Good Progress towards PT goals: Progressing toward goals    Frequency    BID      PT Plan Discharge plan needs to be updated    Co-evaluation              AM-PAC PT "6 Clicks" Daily Activity  Outcome Measure  Difficulty turning over in bed (including adjusting bedclothes, sheets and blankets)?: None Difficulty moving from lying on back to sitting on the side of the bed? : A Little Difficulty sitting down on and standing up from a chair with arms (e.g., wheelchair, bedside commode, etc,.)?: A Little Help needed moving to and from a bed to chair (including a wheelchair)?: A Little Help needed walking in hospital room?: A Little Help needed climbing 3-5 steps with a railing? : Total 6 Click Score: 17    End of Session Equipment Utilized During Treatment: Gait belt Activity Tolerance: Patient tolerated treatment well Patient left: with call bell/phone within reach;in bed;with bed alarm set;with SCD's reapplied Nurse Communication: Mobility status (plan is for pt to go home rather than SNF) PT Visit Diagnosis: Unsteadiness on feet (R26.81);Other abnormalities of gait and mobility (R26.89);History of falling (Z91.81)     Time: 5916-3846 PT Time Calculation (min) (ACUTE ONLY): 45 min  Charges:  $Gait Training: 8-22 mins $Therapeutic Exercise: 8-22 mins $Therapeutic Activity: 8-22 mins                    G Codes:       Collie Siad PT, DPT 01/28/2017, 10:44 AM

## 2017-01-28 NOTE — Progress Notes (Signed)
Reviewed Discharge summary with verbal understanding. Answered all questions. 1 narcotic Rx given upon discharge. DME walker already delivered. To be Escorted to personal vehicle by ortho staff.

## 2017-01-28 NOTE — Evaluation (Signed)
Occupational Therapy Evaluation Patient Details Name: Pamela Hensley MRN: [id number] DOB: September 16, 1934 Today's Date: 01/28/2017    History of Present Illness Pt is a 82 y/o F who presented s/p fall with subsequent L hip fx.  Pt is now s/p pinning L hip.  Pt's PMH includes breast cancer, MI, osteoporosis, a-fib.   Clinical Impression   Pt seen for OT Evaluation this date. Pt was independent at baseline with ADL, IADL. 1 fall in past 12 months (tripped over cording to leaf blower she was using). Pt presents with minimal pain (decreasing with movement), decreased strength, activity tolerance, safety awareness (requiring VC for hand placement during functional mobility and for NWB'ing status for LLE). Pt educated in pursed lip breathing for pain/SOB mgt, educated in [place] for LB dressing. Pt will benefit from skilled OT services to address noted impairments and functional deficits in basic self care tasks to maximize return to PLOF and minimize risk of future falls/injury/rehospitalization.    Follow Up Recommendations  Home health OT    Equipment Recommendations  3 in 1 bedside commode    Recommendations for Other Services       Precautions / Restrictions Precautions Precautions: Fall Precaution Comments: Has experienced some bradycardia while in hospital Restrictions Weight Bearing Restrictions: Yes LLE Weight Bearing: Non weight bearing (6 weeks)      Mobility Bed Mobility Overal bed mobility: Needs Assistance Bed Mobility: Supine to Sit;Sit to Supine     Supine to sit: Supervision Sit to supine: Supervision   General bed mobility comments: no physical assist required, slightly increased time/effort  Transfers Overall transfer level: Needs assistance Equipment used: Rolling walker (2 wheeled) Transfers: Sit to/from [facility] Sit to Stand: Min guard Stand pivot transfers: Min guard       General transfer comment: mod verbal cues for hand placement to  maximize safety, 1 VC for NWBing for LLE    Balance Overall balance assessment: Needs assistance;History of Falls Sitting-balance support: No upper extremity supported;Feet supported Sitting balance-Leahy Scale: Good     Standing balance support: Bilateral upper extremity supported;During functional activity Standing balance-Leahy Scale: Poor Standing balance comment: Relies on UE support for static and dynamic activities                           ADL either performed or assessed with clinical judgement   ADL Overall ADL's : Needs assistance/impaired Eating/Feeding: Sitting;Set up   Grooming: Sitting;Set up   Upper Body Bathing: Sitting;Set up   Lower Body Bathing: Sitting/lateral leans;Sit to/from stand;Min guard;Set up;Minimal assistance Lower Body Bathing Details (indicate cue type and reason): min guard during sit to stand, min assist for LB bathing for LLE Upper Body Dressing : Set up;Sitting   Lower Body Dressing: Minimal assistance;Sitting/lateral leans;Sit to/from stand Lower Body Dressing Details (indicate cue type and reason): educated in use of AE for LB dressing with verbal instruction and visual demonstration, pt verbalized understanding; pt able to don/doff R sock indep seated but required min assist for LLE dressing tasks Toilet Transfer: Min Lawyer Details (indicate cue type and reason): 1 VC for NWBing for LLE, VC for hand placement to maximize safety Toileting- Clothing Manipulation and Hygiene: Set up;Sit to/from stand;Min guard       Functional mobility during ADLs: Rolling walker;Min guard General ADL Comments: pt generally min assist for LB ADL, spouse able to provide level of support     Vision Baseline Vision/History: Wears glasses Wears  Glasses: At all times Patient Visual Report: No change from baseline Vision Assessment?: No apparent visual deficits     Perception     Praxis      Pertinent  Vitals/Pain Pain Assessment: No/denies pain (0/10 with movement, initially 5/10 pain in L hip in bed)     Hand Dominance Right   Extremity/Trunk Assessment Upper Extremity Assessment Upper Extremity Assessment: Overall WFL for tasks assessed   Lower Extremity Assessment Lower Extremity Assessment: RLE deficits/detail RLE Deficits / Details: Strength grossly 3+/5.     Cervical / Trunk Assessment Cervical / Trunk Assessment: Normal   Communication Communication Communication: No difficulties   Cognition Arousal/Alertness: Awake/alert Behavior During Therapy: WFL for tasks assessed/performed Overall Cognitive Status: Within Functional Limits for tasks assessed                                     General Comments      Exercises Other Exercises Other Exercises: pt educated in PLB techniques with pt able to demonstrate understanding   Shoulder Instructions      Home Living Family/patient expects to be discharged to:: Private residence Living Arrangements: Spouse/significant other Available Help at Discharge: Family;Available 24 hours/day Type of Home: House Home Access: Stairs to enter [employer] of Steps: 1 Entrance Stairs-Rails: None Home Layout: One level     Bathroom Shower/Tub: Occupational psychologist: Standard Bathroom Accessibility: No   Home Equipment: Environmental consultant - 2 wheels;Other (comment) (walking stick)          Prior Functioning/Environment Level of Independence: Independent        Comments: independent with ADL, driving, meals, med mgt, 1 fall in past 12 months resulting in this hospitalization (was using a leave blower and got tripped up over the cording)        OT Problem List: Decreased strength;Decreased activity tolerance;Decreased safety awareness;Decreased knowledge of use of DME or AE;Impaired balance (sitting and/or standing)      OT Treatment/Interventions: Self-care/ADL training;Therapeutic  exercise;Therapeutic activities;Energy conservation;DME and/or AE instruction;Patient/family education    OT Goals(Current goals can be found in the care plan section) Acute Rehab OT Goals Patient Stated Goal: to get stronger OT Goal Formulation: With patient Time For Goal Achievement: 02/11/17 Potential to Achieve Goals: Good  OT Frequency: Min 1X/week   Barriers to D/C:            Co-evaluation              AM-PAC PT "6 Clicks" Daily Activity     Outcome Measure Help from another person eating meals?: None Help from another person taking care of personal grooming?: None Help from another person toileting, which includes using toliet, bedpan, or urinal?: A Little Help from another person bathing (including washing, rinsing, drying)?: A Little Help from another person to put on and taking off regular upper body clothing?: None Help from another person to put on and taking off regular lower body clothing?: A Little 6 Click Score: 21   End of Session Equipment Utilized During Treatment: Rolling walker;Gait belt Nurse Communication: Other (comment) (pt requesting morning medications)  Activity Tolerance: Patient tolerated treatment well Patient left: in bed;with call bell/phone within reach;with family/visitor present;with nursing/sitter in room  OT Visit Diagnosis: Other abnormalities of gait and mobility (R26.89);History of falling (Z91.81);Muscle weakness (generalized) (M62.81)                Time: [phone]  OT Time Calculation (min): 37 min Charges:  OT General Charges $OT Visit: 1 Procedure OT Evaluation $OT Eval Low Complexity: 1 Procedure OT Treatments $Self Care/Home Management : 8-22 mins G-Codes:     Jeni Salles, MPH, MS, OTR/L ascom [phone] 01/28/17, 11:07 AM

## 2017-01-29 ENCOUNTER — Ambulatory Visit: Payer: Medicare Other

## 2017-01-30 ENCOUNTER — Ambulatory Visit
Admission: RE | Admit: 2017-01-30 | Discharge: 2017-01-30 | Disposition: A | Discharge status: Home / Self Care | Payer: Medicare Other | Source: Ambulatory Visit | Attending: Radiation Oncology | Admitting: Radiation Oncology

## 2017-01-30 DIAGNOSIS — E119 Type 2 diabetes mellitus without complications: Secondary | ICD-10-CM | POA: Diagnosis not present

## 2017-01-30 DIAGNOSIS — M129 Arthropathy, unspecified: Secondary | ICD-10-CM | POA: Diagnosis not present

## 2017-01-30 DIAGNOSIS — Z510 Encounter for antineoplastic radiation therapy: Secondary | ICD-10-CM | POA: Diagnosis not present

## 2017-01-30 DIAGNOSIS — Z170 Estrogen receptor positive status [ER+]: Secondary | ICD-10-CM | POA: Diagnosis not present

## 2017-01-30 DIAGNOSIS — I4891 Unspecified atrial fibrillation: Secondary | ICD-10-CM | POA: Diagnosis not present

## 2017-01-30 DIAGNOSIS — D649 Anemia, unspecified: Secondary | ICD-10-CM | POA: Diagnosis not present

## 2017-01-30 DIAGNOSIS — I10 Essential (primary) hypertension: Secondary | ICD-10-CM | POA: Diagnosis not present

## 2017-01-30 DIAGNOSIS — I2510 Atherosclerotic heart disease of native coronary artery without angina pectoris: Secondary | ICD-10-CM | POA: Diagnosis not present

## 2017-01-30 DIAGNOSIS — M818 Other osteoporosis without current pathological fracture: Secondary | ICD-10-CM | POA: Diagnosis not present

## 2017-01-30 DIAGNOSIS — E785 Hyperlipidemia, unspecified: Secondary | ICD-10-CM | POA: Diagnosis not present

## 2017-01-30 DIAGNOSIS — C50511 Malignant neoplasm of lower-outer quadrant of right female breast: Secondary | ICD-10-CM | POA: Diagnosis not present

## 2017-01-30 DIAGNOSIS — Z79899 Other long term (current) drug therapy: Secondary | ICD-10-CM | POA: Diagnosis not present

## 2017-01-30 DIAGNOSIS — F419 Anxiety disorder, unspecified: Secondary | ICD-10-CM | POA: Diagnosis not present

## 2017-01-31 ENCOUNTER — Ambulatory Visit
Admission: RE | Admit: 2017-01-31 | Discharge: 2017-01-31 | Disposition: A | Discharge status: Home / Self Care | Payer: Medicare Other | Source: Ambulatory Visit | Attending: Radiation Oncology | Admitting: Radiation Oncology

## 2017-01-31 DIAGNOSIS — C50511 Malignant neoplasm of lower-outer quadrant of right female breast: Secondary | ICD-10-CM | POA: Diagnosis not present

## 2017-02-01 ENCOUNTER — Ambulatory Visit
Admission: RE | Admit: 2017-02-01 | Discharge: 2017-02-01 | Disposition: A | Discharge status: Home / Self Care | Payer: Medicare Other | Source: Ambulatory Visit | Attending: Radiation Oncology | Admitting: Radiation Oncology

## 2017-02-01 DIAGNOSIS — C50511 Malignant neoplasm of lower-outer quadrant of right female breast: Secondary | ICD-10-CM | POA: Diagnosis not present

## 2017-02-04 ENCOUNTER — Ambulatory Visit
Admission: RE | Admit: 2017-02-04 | Discharge: 2017-02-04 | Disposition: A | Discharge status: Home / Self Care | Payer: Medicare Other | Source: Ambulatory Visit | Attending: Radiation Oncology | Admitting: Radiation Oncology

## 2017-02-04 DIAGNOSIS — C50511 Malignant neoplasm of lower-outer quadrant of right female breast: Secondary | ICD-10-CM | POA: Diagnosis not present

## 2017-02-05 ENCOUNTER — Ambulatory Visit
Admission: RE | Admit: 2017-02-05 | Discharge: 2017-02-05 | Disposition: A | Discharge status: Home / Self Care | Payer: Medicare Other | Source: Ambulatory Visit | Attending: Radiation Oncology | Admitting: Radiation Oncology

## 2017-02-05 ENCOUNTER — Ambulatory Visit: Payer: Medicare Other

## 2017-02-05 DIAGNOSIS — C50511 Malignant neoplasm of lower-outer quadrant of right female breast: Secondary | ICD-10-CM | POA: Diagnosis not present

## 2017-02-06 ENCOUNTER — Ambulatory Visit
Admission: RE | Admit: 2017-02-06 | Discharge: 2017-02-06 | Disposition: A | Discharge status: Home / Self Care | Payer: Medicare Other | Source: Ambulatory Visit | Attending: Radiation Oncology | Admitting: Radiation Oncology

## 2017-02-06 ENCOUNTER — Ambulatory Visit: Payer: Medicare Other

## 2017-02-06 DIAGNOSIS — C50511 Malignant neoplasm of lower-outer quadrant of right female breast: Secondary | ICD-10-CM | POA: Diagnosis not present

## 2017-02-07 ENCOUNTER — Inpatient Hospital Stay: Payer: Medicare Other | Attending: Radiation Oncology

## 2017-02-07 ENCOUNTER — Ambulatory Visit: Payer: Medicare Other

## 2017-02-07 ENCOUNTER — Ambulatory Visit
Admission: RE | Admit: 2017-02-07 | Discharge: 2017-02-07 | Disposition: A | Discharge status: Home / Self Care | Payer: Medicare Other | Source: Ambulatory Visit | Attending: Radiation Oncology | Admitting: Radiation Oncology

## 2017-02-07 DIAGNOSIS — I10 Essential (primary) hypertension: Secondary | ICD-10-CM | POA: Diagnosis not present

## 2017-02-07 DIAGNOSIS — Z7901 Long term (current) use of anticoagulants: Secondary | ICD-10-CM | POA: Diagnosis not present

## 2017-02-07 DIAGNOSIS — Z79899 Other long term (current) drug therapy: Secondary | ICD-10-CM | POA: Insufficient documentation

## 2017-02-07 DIAGNOSIS — M810 Age-related osteoporosis without current pathological fracture: Secondary | ICD-10-CM | POA: Diagnosis not present

## 2017-02-07 DIAGNOSIS — Z7984 Long term (current) use of oral hypoglycemic drugs: Secondary | ICD-10-CM | POA: Diagnosis not present

## 2017-02-07 DIAGNOSIS — E119 Type 2 diabetes mellitus without complications: Secondary | ICD-10-CM | POA: Diagnosis not present

## 2017-02-07 DIAGNOSIS — E785 Hyperlipidemia, unspecified: Secondary | ICD-10-CM | POA: Insufficient documentation

## 2017-02-07 DIAGNOSIS — Z8781 Personal history of (healed) traumatic fracture: Secondary | ICD-10-CM | POA: Insufficient documentation

## 2017-02-07 DIAGNOSIS — I2510 Atherosclerotic heart disease of native coronary artery without angina pectoris: Secondary | ICD-10-CM | POA: Insufficient documentation

## 2017-02-07 DIAGNOSIS — Z7982 Long term (current) use of aspirin: Secondary | ICD-10-CM | POA: Insufficient documentation

## 2017-02-07 DIAGNOSIS — Z170 Estrogen receptor positive status [ER+]: Secondary | ICD-10-CM | POA: Insufficient documentation

## 2017-02-07 DIAGNOSIS — Z923 Personal history of irradiation: Secondary | ICD-10-CM | POA: Diagnosis not present

## 2017-02-07 DIAGNOSIS — I4891 Unspecified atrial fibrillation: Secondary | ICD-10-CM | POA: Insufficient documentation

## 2017-02-07 DIAGNOSIS — M129 Arthropathy, unspecified: Secondary | ICD-10-CM | POA: Insufficient documentation

## 2017-02-07 DIAGNOSIS — Z9181 History of falling: Secondary | ICD-10-CM | POA: Diagnosis not present

## 2017-02-07 DIAGNOSIS — I252 Old myocardial infarction: Secondary | ICD-10-CM | POA: Diagnosis not present

## 2017-02-07 DIAGNOSIS — F419 Anxiety disorder, unspecified: Secondary | ICD-10-CM | POA: Diagnosis not present

## 2017-02-07 DIAGNOSIS — C50511 Malignant neoplasm of lower-outer quadrant of right female breast: Secondary | ICD-10-CM | POA: Insufficient documentation

## 2017-02-07 LAB — CBC
HEMATOCRIT: 33.4 % — AB (ref 35.0–47.0)
Hemoglobin: 11.5 g/dL — ABNORMAL LOW (ref 12.0–16.0)
MCH: 29.7 pg (ref 26.0–34.0)
MCHC: 34.4 g/dL (ref 32.0–36.0)
MCV: 86.5 fL (ref 80.0–100.0)
Platelets: 164 10*3/uL (ref 150–440)
RBC: 3.86 MIL/uL (ref 3.80–5.20)
RDW: 13.5 % (ref 11.5–14.5)
WBC: 10 10*3/uL (ref 3.6–11.0)

## 2017-02-08 ENCOUNTER — Ambulatory Visit: Payer: Medicare Other

## 2017-02-11 ENCOUNTER — Ambulatory Visit
Admission: RE | Admit: 2017-02-11 | Discharge: 2017-02-11 | Disposition: A | Discharge status: Home / Self Care | Payer: Medicare Other | Source: Ambulatory Visit | Attending: Radiation Oncology | Admitting: Radiation Oncology

## 2017-02-11 DIAGNOSIS — C50511 Malignant neoplasm of lower-outer quadrant of right female breast: Secondary | ICD-10-CM | POA: Diagnosis not present

## 2017-02-12 ENCOUNTER — Ambulatory Visit
Admission: RE | Admit: 2017-02-12 | Discharge: 2017-02-12 | Disposition: A | Discharge status: Home / Self Care | Payer: Medicare Other | Source: Ambulatory Visit | Attending: Radiation Oncology | Admitting: Radiation Oncology

## 2017-02-12 DIAGNOSIS — C50511 Malignant neoplasm of lower-outer quadrant of right female breast: Secondary | ICD-10-CM | POA: Diagnosis not present

## 2017-02-13 ENCOUNTER — Ambulatory Visit
Admission: RE | Admit: 2017-02-13 | Discharge: 2017-02-13 | Disposition: A | Discharge status: Home / Self Care | Payer: Medicare Other | Source: Ambulatory Visit | Attending: Radiation Oncology | Admitting: Radiation Oncology

## 2017-02-13 DIAGNOSIS — C50511 Malignant neoplasm of lower-outer quadrant of right female breast: Secondary | ICD-10-CM | POA: Diagnosis not present

## 2017-02-14 ENCOUNTER — Ambulatory Visit: Payer: Medicare Other

## 2017-02-14 ENCOUNTER — Ambulatory Visit
Admission: RE | Admit: 2017-02-14 | Discharge: 2017-02-14 | Disposition: A | Discharge status: Home / Self Care | Payer: Medicare Other | Source: Ambulatory Visit | Attending: Radiation Oncology | Admitting: Radiation Oncology

## 2017-02-14 DIAGNOSIS — C50511 Malignant neoplasm of lower-outer quadrant of right female breast: Secondary | ICD-10-CM | POA: Diagnosis not present

## 2017-02-15 ENCOUNTER — Ambulatory Visit
Admission: RE | Admit: 2017-02-15 | Discharge: 2017-02-15 | Disposition: A | Discharge status: Home / Self Care | Payer: Medicare Other | Source: Ambulatory Visit | Attending: Radiation Oncology | Admitting: Radiation Oncology

## 2017-02-15 ENCOUNTER — Ambulatory Visit: Payer: Medicare Other

## 2017-02-15 DIAGNOSIS — C50511 Malignant neoplasm of lower-outer quadrant of right female breast: Secondary | ICD-10-CM | POA: Diagnosis not present

## 2017-02-18 ENCOUNTER — Ambulatory Visit
Admission: RE | Admit: 2017-02-18 | Discharge: 2017-02-18 | Disposition: A | Discharge status: Home / Self Care | Payer: Medicare Other | Source: Ambulatory Visit | Attending: Radiation Oncology | Admitting: Radiation Oncology

## 2017-02-18 ENCOUNTER — Ambulatory Visit: Payer: Medicare Other

## 2017-02-18 DIAGNOSIS — C50511 Malignant neoplasm of lower-outer quadrant of right female breast: Secondary | ICD-10-CM | POA: Diagnosis not present

## 2017-02-18 NOTE — Progress Notes (Signed)
[name]  [phone] Fax:(336) [phone]  ID: Pamela Hensley OB: 1935-04-27  MR#: [id number]  KZS#:[id number]  Patient Care Team: Rusty Aus, MD as PCP - General (Internal Medicine)  CHIEF COMPLAINT: Pathologic stage IA ER/PR positive, HER-2 negative invasive carcinoma of the lower outer quadrant of the right breast.  INTERVAL HISTORY: Patient returns to clinic today for further evaluation, discussion of treatment. She tolerated her lumpectomy without significant side effects. XRT completed today. She did have a suffer a fall on 01/25/17 and suffered a fracture of the left hip which was surgically repaired by Dr. Mack Guise on 01/26/17. She was discharged to rehabilitation and today is in a wheelchair and says she is nwb at this time. She denies neurologic complaints, recent fevers, and has a good appetite. She denies chest pain and/or sob, nausea, vomiting, constipation, or diarrhea, urinary complaints.      REVIEW OF SYSTEMS:   Review of Systems  Constitutional: Positive for weight loss. Negative for fever and malaise/fatigue.  Respiratory: Negative.  Negative for shortness of breath.   Cardiovascular: Negative.  Negative for chest pain and leg swelling.  Gastrointestinal: Negative.  Negative for abdominal pain.  Genitourinary: Negative.   Musculoskeletal: Positive for falls and joint pain.  Skin: Negative.  Negative for rash.  Neurological: Negative for dizziness, sensory change, weakness and headaches.  Psychiatric/Behavioral: Negative.  The patient is not nervous/anxious.     As per HPI. Otherwise, a complete review of systems is negative.  PAST MEDICAL HISTORY: Past Medical History:  Diagnosis Date  . Anemia   . Anxiety   . Arthritis    osteoarthritis  . Atrial fibrillation ([place])   . Cancer (HCC)    Breast  . Coronary artery disease   . Diabetes mellitus without complication ([place])   . Hyperlipidemia   . Hypertension   . Myocardial  infarction ([place]) 08/05  . Osteoporosis     PAST SURGICAL HISTORY: Past Surgical History:  Procedure Laterality Date  . ABDOMINAL HYSTERECTOMY    . APPENDECTOMY    . BLADDER SURGERY    . COLON SURGERY  10/99   hemi-colectomy  . HIP PINNING,CANNULATED Left 01/26/2017   Procedure: CANNULATED HIP PINNING;  Surgeon: Thornton Park, MD;  Location: [facility];  Service: Orthopedics;  Laterality: Left;  Marland Kitchen MASS EXCISION Right 04/15/2015   Procedure: MINOR EXCISION OF MASS INCLUSION RIGHT NECK CYST;  Surgeon: Beverly Gust, MD;  Location: [facility];  Service: ENT;  Laterality: Right;  ** local only **  . PARTIAL MASTECTOMY WITH NEEDLE LOCALIZATION Right 12/13/2016   Procedure: PARTIAL MASTECTOMY WITH NEEDLE LOCALIZATION;  Surgeon: Leonie Green, MD;  Location: [facility];  Service: General;  Laterality: Right;  . SENTINEL NODE BIOPSY Right 12/13/2016   Procedure: SENTINEL NODE BIOPSY;  Surgeon: Leonie Green, MD;  Location: [facility];  Service: General;  Laterality: Right;    FAMILY HISTORY: Family History  Problem Relation Age of Onset  . Breast cancer Neg Hx     ADVANCED DIRECTIVES (Y/N):  N  HEALTH MAINTENANCE: Social History  Substance Use Topics  . Smoking status: Never Smoker  . Smokeless tobacco: Never Used  . Alcohol use No     Colonoscopy:  PAP:  Bone density: 09/06/16 - Dr. Sabra Heck referring MD - osteoporosis  Lipid panel: 02/18/17 (Care Everywhere)  No Known Allergies  Current Outpatient Prescriptions  Medication Sig Dispense Refill  . acetaminophen (TYLENOL) 325 MG tablet Take 325 mg by mouth every 6 (six)  hours as needed for mild pain or headache.    Marland Kitchen aspirin 81 MG tablet Take 81 mg by mouth daily.    . cetirizine (ZYRTEC) 10 MG tablet Take 10 mg by mouth daily as needed for allergies.     . Cholecalciferol (VITAMIN D PO) Take 1 tablet by mouth daily.    . digoxin (LANOXIN) 0.125 MG tablet Take 0.125 mg by mouth daily. AM    . diltiazem (CARDIZEM)  120 MG tablet Take 120 mg by mouth daily. AM    . docusate sodium (COLACE) 100 MG capsule Take 1 capsule (100 mg total) by mouth 2 (two) times daily. 10 capsule 0  . glimepiride (AMARYL) 2 MG tablet TAKE (1) TABLET BY MOUTH DAILY FOR DIABETES    . hydrochlorothiazide (HYDRODIURIL) 25 MG tablet Take 25 mg by mouth daily as needed (swelling).     Marland Kitchen MAGNESIUM PO Take 250 mg by mouth every morning.     . metFORMIN (GLUCOPHAGE) 500 MG tablet Take 500 mg by mouth 2 (two) times daily with a meal.     . methocarbamol (ROBAXIN) 500 MG tablet Take 1 tablet (500 mg total) by mouth every 6 (six) hours as needed for muscle spasms. 60 tablet 0  . metoprolol succinate (TOPROL-XL) 25 MG 24 hr tablet Take 0.5 tablets (12.5 mg total) by mouth every morning. 30 tablet 3  . polyethylene glycol (MIRALAX / GLYCOLAX) packet Take 17 g by mouth daily as needed for mild constipation. 14 each 0  . senna (SENOKOT) 8.6 MG TABS tablet Take 1 tablet (8.6 mg total) by mouth 2 (two) times daily. 120 each 0  . simvastatin (ZOCOR) 40 MG tablet Take 40 mg by mouth daily at 6 PM. PM     . sotalol (BETAPACE) 80 MG tablet Take 80 mg by mouth 2 (two) times daily. AM     . venlafaxine (EFFEXOR) 75 MG tablet Take 75 mg by mouth daily. AM    . warfarin (COUMADIN) 3 MG tablet Take 4.5 mg by mouth daily. PM    . alendronate (FOSAMAX) 70 MG tablet Take 1 tablet (70 mg total) by mouth once a week. Take with a full glass of water on an empty stomach. 12 tablet 3  . Calcium Carbonate-Vitamin D (CALCIUM 600+D) 600-400 MG-UNIT tablet Take 2 tablets by mouth daily. 90 tablet 7  . HYDROcodone-acetaminophen (NORCO/VICODIN) 5-325 MG tablet Take 1-2 tablets by mouth every 6 (six) hours as needed for moderate pain. (Patient not taking: Reported on 02/20/2017) 30 tablet 0  . tamoxifen (NOLVADEX) 20 MG tablet Take 1 tablet (20 mg total) by mouth daily. 90 tablet 3   No current facility-administered medications for this visit.     OBJECTIVE: Vitals:    02/20/17 1003  BP: 117/71  Pulse: 75  Resp: 18  Temp: (!) 97 F (36.1 C)     Body mass index is 22.42 kg/m.    ECOG FS:2 - Symptomatic, <50% confined to bed  General: Elderly, somewhat frail appearing woman in no acute distress. In wheelchair, accompanied by daughter Eyes: Pink conjunctiva, anicteric sclera. HEENT: Normocephalic, moist mucous membranes, clear oropharnyx. Breasts: Well-healed surgical scar on right breast. Lungs: Clear to auscultation bilaterally. Heart: Regular rate and rhythm. No rubs, murmurs, or gallops. Abdomen: Soft, nontender, nondistended. No organomegaly noted, normoactive bowel sounds. Musculoskeletal: No edema, cyanosis, or clubbing. Neuro: Alert, answering all questions appropriately. Cranial nerves grossly intact. Skin: No rashes or petechiae noted. Psych: Normal affect.   LAB RESULTS:  Lab Results  Component Value Date   NA 138 01/28/2017   K 4.0 01/28/2017   CL 103 01/28/2017   CO2 27 01/28/2017   GLUCOSE 165 (H) 01/28/2017   BUN 28 (H) 01/28/2017   CREATININE 1.10 (H) 01/28/2017   CALCIUM 9.0 01/28/2017   GFRNONAA 46 (L) 01/28/2017   GFRAA 53 (L) 01/28/2017    Lab Results  Component Value Date   WBC 10.0 02/07/2017   NEUTROABS 6.2 01/25/2017   HGB 11.5 (L) 02/07/2017   HCT 33.4 (L) 02/07/2017   MCV 86.5 02/07/2017   PLT 164 02/07/2017     STUDIES: Dg Chest 1 View  Result Date: 01/25/2017 CLINICAL DATA:  Fall EXAM: CHEST 1 VIEW COMPARISON:  None. FINDINGS: Lungs are clear.  No pleural effusion or pneumothorax. The heart is normal in size. IMPRESSION: No evidence of acute cardiopulmonary disease. Electronically Signed   By: Julian Hy M.D.   On: 01/25/2017 17:41   Dg Hip Port Unilat With Pelvis 1v Left  Result Date: 01/26/2017 CLINICAL DATA:  Post LEFT hip surgery EXAM: DG HIP (WITH OR WITHOUT PELVIS) 1V PORT LEFT COMPARISON:  Portable exam 1031 hours compared to 720 1,018 FINDINGS: Diffuse osseous demineralization. Three  cannulated screws at proximal LEFT femur across a subcapital fracture of the LEFT femoral neck. Hip joint spaces and SI joints preserved. No additional fracture, dislocation, or bone destruction. Scattered pelvic phleboliths. Lateral soft tissue swelling and skin clips related to surgery. IMPRESSION: Post pinning of the previously identified subcapital fracture of the LEFT femoral neck. Electronically Signed   By: Lavonia Dana M.D.   On: 01/26/2017 10:44   Dg Hip Operative Unilat W Or W/o Pelvis Left  Result Date: 01/26/2017 CLINICAL DATA:  Left hip fracture EXAM: OPERATIVE left HIP (WITH PELVIS IF PERFORMED) 2 VIEWS TECHNIQUE: Fluoroscopic spot image(s) were submitted for interpretation post-operatively. COMPARISON:  Radiography from yesterday FINDINGS: Three cannulated screws were placed across the left femoral neck fracture. The hip is located. No evidence of intra procedural fracture. IMPRESSION: Fluoroscopy for left femoral neck fracture ORIF. Electronically Signed   By: Monte Fantasia M.D.   On: 01/26/2017 10:12   Dg Hip Unilat W Or Wo Pelvis 2-3 Views Left  Result Date: 01/25/2017 CLINICAL DATA:  Patient tripped over a power cord and fell. EXAM: DG HIP (WITH OR WITHOUT PELVIS) 2-3V LEFT COMPARISON:  None. FINDINGS: Frontal pelvis shows diffuse bony demineralization. The SI joints and symphysis pubis are unremarkable. AP and cross-table lateral views of the left hip show a subcapital to transcervical femoral neck fracture with mild valgus angulation. IMPRESSION: Acute femoral neck fracture with mild valgus deformity. Electronically Signed   By: Misty Stanley M.D.   On: 01/25/2017 17:42    ASSESSMENT: Pathologic stage IA ER/PR positive, HER-2 negative invasive carcinoma of the lower outer quadrant of the right breast.  PLAN:   1. 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 Given  the small size of patient's tumor, Oncotype testing is not necessary. Patient does not require adjuvant chemotherapy. Patient completed XRT today which was somewhat delayed in completion due to surgery for hip fracture. In setting of osteoporosis and recent fracture will opt for tamoxifen vs  AI. Discussed side effects and potential risks of tamoxifen not limited to but including: abnormal clotting, hot flashes, nausea, vomiting, dizziness, fatigue. Will have patient return to clinic in 3 months for  follow up. Mammogram due June 2019.  2. Osteoporosis: DEXA scan from 09/2016 reviewed in [place] shows osteoporosis and recommends repeat scan 09/2017. Patient's daughter reports that she was previously on Fosamax and she never refilled it.. Will re-start Fosamax today and add Calcium 1226m and Vitamin D 800 u. Encouraged patient to return to primary care for continued management and followup for osteoporosis including scheduling of next bone density scan. Appreciate primary care's input.    Approximately 30 minutes was spent in discussion of which greater than 50% was consultation.   Patient expressed understanding and was in agreement with this plan. She also understands that She can call clinic at any time with any questions, concerns, or complaints.   Cancer Staging Primary cancer of lower-outer quadrant of right breast ([facility] Staging form: Breast, AJCC 8th Edition - Clinical stage from 11/11/2016: Stage IA (cT1a, cN0, cM0, G1, ER: Positive, PR: Positive, HER2: Negative) - Signed by FLloyd Huger MD on 11/11/2016  LBeverely Risen AZenia Resides NP 02/20/17 1354 PM  Patient was seen and evaluated independently and I agree with the assessment and plan as dictated above.  TLloyd Huger MD 02/21/17 10:11 AM

## 2017-02-19 ENCOUNTER — Ambulatory Visit
Admission: RE | Admit: 2017-02-19 | Discharge: 2017-02-19 | Disposition: A | Discharge status: Home / Self Care | Payer: Medicare Other | Source: Ambulatory Visit | Attending: Radiation Oncology | Admitting: Radiation Oncology

## 2017-02-19 ENCOUNTER — Ambulatory Visit: Payer: Medicare Other

## 2017-02-19 DIAGNOSIS — C50511 Malignant neoplasm of lower-outer quadrant of right female breast: Secondary | ICD-10-CM | POA: Diagnosis not present

## 2017-02-20 ENCOUNTER — Ambulatory Visit
Admission: RE | Admit: 2017-02-20 | Discharge: 2017-02-20 | Disposition: A | Discharge status: Home / Self Care | Payer: Medicare Other | Source: Ambulatory Visit | Attending: Radiation Oncology | Admitting: Radiation Oncology

## 2017-02-20 ENCOUNTER — Inpatient Hospital Stay (HOSPITAL_BASED_OUTPATIENT_CLINIC_OR_DEPARTMENT_OTHER): Payer: Medicare Other | Admitting: Oncology

## 2017-02-20 VITALS — BP 117/71 | HR 75 | Temp 97.0°F | Resp 18 | Wt 111.0 lb

## 2017-02-20 DIAGNOSIS — F419 Anxiety disorder, unspecified: Secondary | ICD-10-CM

## 2017-02-20 DIAGNOSIS — I2510 Atherosclerotic heart disease of native coronary artery without angina pectoris: Secondary | ICD-10-CM | POA: Diagnosis not present

## 2017-02-20 DIAGNOSIS — Z7901 Long term (current) use of anticoagulants: Secondary | ICD-10-CM

## 2017-02-20 DIAGNOSIS — I4891 Unspecified atrial fibrillation: Secondary | ICD-10-CM

## 2017-02-20 DIAGNOSIS — C50511 Malignant neoplasm of lower-outer quadrant of right female breast: Secondary | ICD-10-CM | POA: Diagnosis not present

## 2017-02-20 DIAGNOSIS — I10 Essential (primary) hypertension: Secondary | ICD-10-CM

## 2017-02-20 DIAGNOSIS — E119 Type 2 diabetes mellitus without complications: Secondary | ICD-10-CM | POA: Diagnosis not present

## 2017-02-20 DIAGNOSIS — I252 Old myocardial infarction: Secondary | ICD-10-CM

## 2017-02-20 DIAGNOSIS — E785 Hyperlipidemia, unspecified: Secondary | ICD-10-CM | POA: Diagnosis not present

## 2017-02-20 DIAGNOSIS — Z79899 Other long term (current) drug therapy: Secondary | ICD-10-CM

## 2017-02-20 DIAGNOSIS — Z923 Personal history of irradiation: Secondary | ICD-10-CM

## 2017-02-20 DIAGNOSIS — Z7984 Long term (current) use of oral hypoglycemic drugs: Secondary | ICD-10-CM

## 2017-02-20 DIAGNOSIS — M129 Arthropathy, unspecified: Secondary | ICD-10-CM | POA: Diagnosis not present

## 2017-02-20 DIAGNOSIS — Z9181 History of falling: Secondary | ICD-10-CM

## 2017-02-20 DIAGNOSIS — Z8781 Personal history of (healed) traumatic fracture: Secondary | ICD-10-CM

## 2017-02-20 DIAGNOSIS — M810 Age-related osteoporosis without current pathological fracture: Secondary | ICD-10-CM | POA: Diagnosis not present

## 2017-02-20 DIAGNOSIS — Z170 Estrogen receptor positive status [ER+]: Secondary | ICD-10-CM

## 2017-02-20 DIAGNOSIS — Z7982 Long term (current) use of aspirin: Secondary | ICD-10-CM

## 2017-02-20 MED ORDER — TAMOXIFEN CITRATE 20 MG PO TABS: 20.0000 mg | ORAL_TABLET | Freq: Every day | ORAL | 3 refills | Status: DC

## 2017-02-20 MED ORDER — CALCIUM CARBONATE-VITAMIN D 600-400 MG-UNIT PO TABS: 2.0000 | ORAL_TABLET | Freq: Every day | ORAL | 7 refills | Status: DC

## 2017-02-20 MED ORDER — ALENDRONATE SODIUM 70 MG PO TABS: 70.0000 mg | ORAL_TABLET | ORAL | 3 refills | Status: DC

## 2017-03-08 ENCOUNTER — Encounter (INDEPENDENT_AMBULATORY_CARE_PROVIDER_SITE_OTHER): Payer: Medicare Other

## 2017-03-08 ENCOUNTER — Ambulatory Visit (INDEPENDENT_AMBULATORY_CARE_PROVIDER_SITE_OTHER): Payer: Medicare Other | Admitting: Vascular Surgery

## 2017-03-25 ENCOUNTER — Ambulatory Visit
Admission: RE | Admit: 2017-03-25 | Discharge: 2017-03-25 | Disposition: A | Discharge status: Home / Self Care | Payer: Medicare Other | Source: Ambulatory Visit | Attending: Radiation Oncology | Admitting: Radiation Oncology

## 2017-03-25 ENCOUNTER — Encounter: Payer: Self-pay | Admitting: Radiation Oncology

## 2017-03-25 VITALS — BP 138/77 | HR 67 | Temp 97.1°F | Resp 18 | Wt 112.4 lb

## 2017-03-25 DIAGNOSIS — Z8781 Personal history of (healed) traumatic fracture: Secondary | ICD-10-CM | POA: Diagnosis not present

## 2017-03-25 DIAGNOSIS — Z923 Personal history of irradiation: Secondary | ICD-10-CM | POA: Diagnosis not present

## 2017-03-25 DIAGNOSIS — Z170 Estrogen receptor positive status [ER+]: Secondary | ICD-10-CM | POA: Insufficient documentation

## 2017-03-25 DIAGNOSIS — Z79810 Long term (current) use of selective estrogen receptor modulators (SERMs): Secondary | ICD-10-CM | POA: Insufficient documentation

## 2017-03-25 DIAGNOSIS — C50411 Malignant neoplasm of upper-outer quadrant of right female breast: Secondary | ICD-10-CM | POA: Diagnosis present

## 2017-03-25 NOTE — Progress Notes (Signed)
Radiation Oncology Follow up Note  Name: Pamela Hensley   Date:   03/25/2017 MRN:  [id number] DOB: 08-Jun-1935    This 82 y.o. female presents to the clinic today for one-month follow-up status post whole breast radiation to her right breast for stage I invasive mammary carcinoma.Marland Kitchen  REFERRING PROVIDER: Rusty Aus, MD  HPI: Patient is a 82 year old female now out 1 month having completed whole breast radiation to her right breast for stage I ER/PR positive invasive mammary carcinoma. She seen today in routine follow-up and is doing well her treatments were complicated by fractured hip which she is recovering nicely from at this point. She specifically denies breast tenderness cough or bone pain. Patient has started on. Tamoxifen and is tolerating that well without side effect  COMPLICATIONS OF TREATMENT: none  FOLLOW UP COMPLIANCE: keeps appointments   PHYSICAL EXAM:  BP 138/77   Pulse 67   Temp (!) 97.1 F (36.2 C)   Resp 18   Wt 112 lb 7 oz (51 kg)   BMI 22.71 kg/m  Lungs are clear to A&P cardiac examination essentially unremarkable with regular rate and rhythm. No dominant mass or nodularity is noted in either breast in 2 positions examined. Incision is well-healed. No axillary or supraclavicular adenopathy is appreciated. Cosmetic result is excellent. Well-developed well-nourished patient in NAD. HEENT reveals PERLA, EOMI, discs not visualized.  Oral cavity is clear. No oral mucosal lesions are identified. Neck is clear without evidence of cervical or supraclavicular adenopathy. Lungs are clear to A&P. Cardiac examination is essentially unremarkable with regular rate and rhythm without murmur rub or thrill. Abdomen is benign with no organomegaly or masses noted. Motor sensory and DTR levels are equal and symmetric in the upper and lower extremities. Cranial nerves II through XII are grossly intact. Proprioception is intact. No peripheral adenopathy or edema is identified. No motor or  sensory levels are noted. Crude visual fields are within normal range.  RADIOLOGY RESULTS: No current films for review  PLAN: Present time patient is doing well 1 month out from whole breast radiation. I'm please were overall progress. I've asked to see her back in 4-5 months for follow-up. She continues on tamoxifen without side effect. Patient knows to call sooner with any concerns.  I would like to take this opportunity to thank you for allowing me to participate in the care of your patient.Armstead Peaks., MD

## 2017-04-29 ENCOUNTER — Encounter: Payer: Self-pay | Admitting: Oncology

## 2017-05-22 ENCOUNTER — Ambulatory Visit: Payer: Medicare Other | Admitting: Oncology

## 2017-06-09 NOTE — Progress Notes (Signed)
[place]  Telephone:(336) [phone] Fax:(336) [phone]  ID: Pamela Hensley OB: 11-29-1934  MR#: [id number]  VZD#:[id number]  Patient Care Team: Rusty Aus, MD as PCP - General (Internal Medicine)  CHIEF COMPLAINT: Pathologic stage IA ER/PR positive, HER-2 negative invasive carcinoma of the lower outer quadrant of the right breast.  INTERVAL HISTORY: Patient returns to clinic today for routine 55-monthevaluation.  She is tolerating tamoxifen well without significant side effects. She currently she feels well and is asymptomatic. She has no neurologic complaints. She denies any recent fevers or illnesses. She has a good appetite and denies weight loss. She has no chest pain or shortness of breath. She denies any nausea, vomiting, constipation, or diarrhea. She has no urinary complaints. Patient feels at her baseline and offers no specific complaints today.    REVIEW OF SYSTEMS:   Review of Systems  Constitutional: Negative.  Negative for fever, malaise/fatigue and weight loss.  Respiratory: Negative.  Negative for shortness of breath.   Cardiovascular: Negative.  Negative for chest pain and leg swelling.  Gastrointestinal: Negative.  Negative for abdominal pain.  Genitourinary: Negative.   Musculoskeletal: Negative.  Negative for joint pain.  Skin: Negative.  Negative for rash.  Neurological: Negative.  Negative for sensory change and weakness.  Psychiatric/Behavioral: Negative.  The patient is not nervous/anxious.     As per HPI. Otherwise, a complete review of systems is negative.  PAST MEDICAL HISTORY: Past Medical History:  Diagnosis Date  . Anemia   . Anxiety   . Arthritis    osteoarthritis  . [place]   . Cancer (HCC)    Breast  . Coronary artery disease   . [place]   . Hyperlipidemia   . Hypertension   . [place]  . Osteoporosis     PAST SURGICAL HISTORY: Past  Surgical History:  Procedure Laterality Date  . ABDOMINAL HYSTERECTOMY    . APPENDECTOMY    . BLADDER SURGERY    . COLON SURGERY  10/99   hemi-colectomy  . HIP PINNING,CANNULATED Left 01/26/2017   Procedure: CANNULATED HIP PINNING;  Surgeon: KThornton Park MD;  Location: [facility];  Service: Orthopedics;  Laterality: Left;  .Marland KitchenMASS EXCISION Right 04/15/2015   Procedure: MINOR EXCISION OF MASS INCLUSION RIGHT NECK CYST;  Surgeon: CBeverly Gust MD;  [address]  Service: ENT;  Laterality: Right;  ** local only **  . PARTIAL MASTECTOMY WITH NEEDLE LOCALIZATION Right 12/13/2016   Procedure: PARTIAL MASTECTOMY WITH NEEDLE LOCALIZATION;  Surgeon: SLeonie Green MD;  Location: [facility];  Service: General;  Laterality: Right;  . SENTINEL NODE BIOPSY Right 12/13/2016   Procedure: SENTINEL NODE BIOPSY;  Surgeon: SLeonie Green MD;  Location: [facility];  Service: General;  Laterality: Right;    FAMILY HISTORY: Family History  Problem Relation Age of Onset  . Breast cancer Neg Hx     ADVANCED DIRECTIVES (Y/N):  N  HEALTH MAINTENANCE: Social History   Tobacco Use  . Smoking status: Never Smoker  . Smokeless tobacco: Never Used  Substance Use Topics  . Alcohol use: No  . Drug use: No     Colonoscopy:  PAP:  Bone density:  Lipid panel:  No Known Allergies  Current Outpatient Medications  Medication Sig Dispense Refill  . acetaminophen (TYLENOL) 325 MG tablet Take 325 mg by mouth every 6 (six) hours as needed for mild pain or headache.    . alendronate (FOSAMAX)  70 MG tablet Take 1 tablet (70 mg total) by mouth once a week. Take with a full glass of water on an empty stomach. 12 tablet 3  . aspirin 81 MG tablet Take 81 mg by mouth daily.    . Calcium Carbonate-Vitamin D (CALCIUM 600+D) 600-400 MG-UNIT tablet Take 2 tablets by mouth daily. 90 tablet 7  . cetirizine (ZYRTEC) 10 MG tablet Take 10 mg by mouth daily as needed for allergies.     .  Cholecalciferol (VITAMIN D PO) Take 1 tablet by mouth daily.    . digoxin (LANOXIN) 0.125 MG tablet Take 0.125 mg by mouth daily. AM    . diltiazem (CARDIZEM) 120 MG tablet Take 120 mg by mouth daily. AM    . docusate sodium (COLACE) 100 MG capsule Take 1 capsule (100 mg total) by mouth 2 (two) times daily. 10 capsule 0  . glimepiride (AMARYL) 2 MG tablet TAKE (1) TABLET BY MOUTH DAILY FOR DIABETES    . hydrochlorothiazide (HYDRODIURIL) 25 MG tablet Take 25 mg by mouth daily as needed (swelling).     Marland Kitchen HYDROcodone-acetaminophen (NORCO/VICODIN) 5-325 MG tablet Take 1-2 tablets by mouth every 6 (six) hours as needed for moderate pain. 30 tablet 0  . MAGNESIUM PO Take 250 mg by mouth every morning.     . metFORMIN (GLUCOPHAGE) 500 MG tablet Take 500 mg by mouth 2 (two) times daily with a meal.     . methocarbamol (ROBAXIN) 500 MG tablet Take 1 tablet (500 mg total) by mouth every 6 (six) hours as needed for muscle spasms. 60 tablet 0  . metoprolol succinate (TOPROL-XL) 25 MG 24 hr tablet Take 0.5 tablets (12.5 mg total) by mouth every morning. 30 tablet 3  . polyethylene glycol (MIRALAX / GLYCOLAX) packet Take 17 g by mouth daily as needed for mild constipation. 14 each 0  . senna (SENOKOT) 8.6 MG TABS tablet Take 1 tablet (8.6 mg total) by mouth 2 (two) times daily. 120 each 0  . simvastatin (ZOCOR) 40 MG tablet Take 40 mg by mouth daily at 6 PM. PM     . sotalol (BETAPACE) 80 MG tablet Take 80 mg by mouth 2 (two) times daily. AM     . tamoxifen (NOLVADEX) 20 MG tablet Take 1 tablet (20 mg total) by mouth daily. 90 tablet 3  . venlafaxine (EFFEXOR) 75 MG tablet Take 75 mg by mouth daily. AM    . warfarin (COUMADIN) 3 MG tablet Take 4.5 mg by mouth daily. PM     No current facility-administered medications for this visit.     OBJECTIVE: Vitals:   06/10/17 1518  BP: 112/76  Resp: 18  Temp: 97.7 F (36.5 C)     Body mass index is 22.54 kg/m.    ECOG FS:0 - Asymptomatic  General:  Well-developed, well-nourished, no acute distress. Eyes: Pink conjunctiva, anicteric sclera. Breasts: Well-healed surgical scar on right breast. Lungs: Clear to auscultation bilaterally. Heart: Regular rate and rhythm. No rubs, murmurs, or gallops. Abdomen: Soft, nontender, nondistended. No organomegaly noted, normoactive bowel sounds. Musculoskeletal: No edema, cyanosis, or clubbing. Neuro: Alert, answering all questions appropriately. Cranial nerves grossly intact. Skin: No rashes or petechiae noted. Psych: Normal affect.   LAB RESULTS:  Lab Results  Component Value Date   NA 138 01/28/2017   K 4.0 01/28/2017   CL 103 01/28/2017   CO2 27 01/28/2017   GLUCOSE 165 (H) 01/28/2017   BUN 28 (H) 01/28/2017   CREATININE 1.10 (H) 01/28/2017  CALCIUM 9.0 01/28/2017   GFRNONAA 46 (L) 01/28/2017   GFRAA 53 (L) 01/28/2017    Lab Results  Component Value Date   WBC 10.0 02/07/2017   NEUTROABS 6.2 01/25/2017   HGB 11.5 (L) 02/07/2017   HCT 33.4 (L) 02/07/2017   MCV 86.5 02/07/2017   PLT 164 02/07/2017     STUDIES: No results found.  ASSESSMENT: Pathologic stage IA ER/PR positive, HER-2 negative invasive carcinoma of the lower outer quadrant of the right breast.  PLAN:   1. 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 Given the small size of patient's tumor, Oncotype testing was not necessary and she did not require adjuvant chemotherapy.  She completed adjuvant XRT and initiated tamoxifen in approximately August 2018.  Tamoxifen was initiated secondary to her hip fracture and osteoporosis.  She will take this for 5 years completing in August 2023.  Her most recent mammogram on December 13, 2016 was reported as BI-RADS 2 repeat in June 2019.  Return to clinic in 6 months or further evaluation. 2.  Hip fracture: Resolved. 3.  Hypertension: Patient's blood pressure is  within normal limits today.    Approximately 20 minutes was spent in discussion of which greater than 50% was consultation.   Patient expressed understanding and was in agreement with this plan. She also understands that She can call clinic at any time with any questions, concerns, or complaints.   Cancer Staging Primary cancer of lower-outer quadrant of right breast [facility]) Staging form: Breast, AJCC 8th Edition - Clinical stage from 11/11/2016: Stage IA (cT1a, cN0, cM0, G1, ER: Positive, PR: Positive, HER2: Negative) - Signed by Lloyd Huger, MD on 11/11/2016   Lloyd Huger, MD   06/10/2017 3:35 PM

## 2017-06-10 ENCOUNTER — Inpatient Hospital Stay: Payer: Medicare Other | Attending: Oncology | Admitting: Oncology

## 2017-06-10 VITALS — BP 112/76 | Temp 97.7°F | Resp 18 | Wt 111.6 lb

## 2017-06-10 DIAGNOSIS — F419 Anxiety disorder, unspecified: Secondary | ICD-10-CM | POA: Insufficient documentation

## 2017-06-10 DIAGNOSIS — M1990 Unspecified osteoarthritis, unspecified site: Secondary | ICD-10-CM | POA: Diagnosis not present

## 2017-06-10 DIAGNOSIS — Z170 Estrogen receptor positive status [ER+]: Secondary | ICD-10-CM | POA: Insufficient documentation

## 2017-06-10 DIAGNOSIS — E785 Hyperlipidemia, unspecified: Secondary | ICD-10-CM | POA: Diagnosis not present

## 2017-06-10 DIAGNOSIS — E119 Type 2 diabetes mellitus without complications: Secondary | ICD-10-CM | POA: Insufficient documentation

## 2017-06-10 DIAGNOSIS — Z7901 Long term (current) use of anticoagulants: Secondary | ICD-10-CM | POA: Insufficient documentation

## 2017-06-10 DIAGNOSIS — I252 Old myocardial infarction: Secondary | ICD-10-CM | POA: Insufficient documentation

## 2017-06-10 DIAGNOSIS — Z7982 Long term (current) use of aspirin: Secondary | ICD-10-CM | POA: Insufficient documentation

## 2017-06-10 DIAGNOSIS — M810 Age-related osteoporosis without current pathological fracture: Secondary | ICD-10-CM | POA: Insufficient documentation

## 2017-06-10 DIAGNOSIS — I10 Essential (primary) hypertension: Secondary | ICD-10-CM | POA: Insufficient documentation

## 2017-06-10 DIAGNOSIS — I2510 Atherosclerotic heart disease of native coronary artery without angina pectoris: Secondary | ICD-10-CM | POA: Diagnosis not present

## 2017-06-10 DIAGNOSIS — Z79810 Long term (current) use of selective estrogen receptor modulators (SERMs): Secondary | ICD-10-CM | POA: Diagnosis not present

## 2017-06-10 DIAGNOSIS — I4891 Unspecified atrial fibrillation: Secondary | ICD-10-CM | POA: Diagnosis not present

## 2017-06-10 DIAGNOSIS — Z7984 Long term (current) use of oral hypoglycemic drugs: Secondary | ICD-10-CM | POA: Diagnosis not present

## 2017-06-10 DIAGNOSIS — C50511 Malignant neoplasm of lower-outer quadrant of right female breast: Secondary | ICD-10-CM | POA: Diagnosis present

## 2017-06-10 NOTE — Addendum Note (Signed)
Addended by: Luretha Murphy on: 06/10/2017 03:45 PM   Modules accepted: Orders

## 2017-08-07 IMAGING — US US BREAST*R* LIMITED INC AXILLA
1 series · 13 of 15 positions shown · non-contrast
Comparison: Previous exam(s).

CLINICAL DATA: Right breast follow-up

EXAM:
2D DIGITAL DIAGNOSTIC BILATERAL MAMMOGRAM WITH CAD AND ADJUNCT TOMO
ULTRASOUND RIGHT BREAST

[Series 1: us breast*right* limited inc axilla · 0.05mm/px · 13 of 15 slices shown]
[im 1/15]
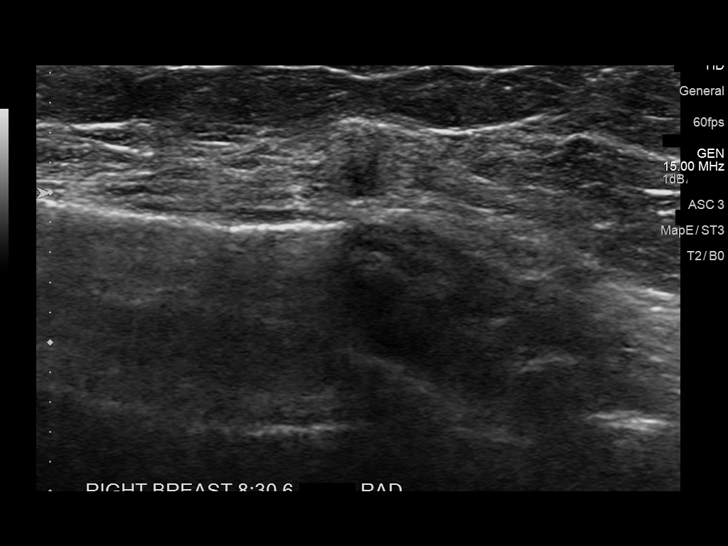
[im 2/15]
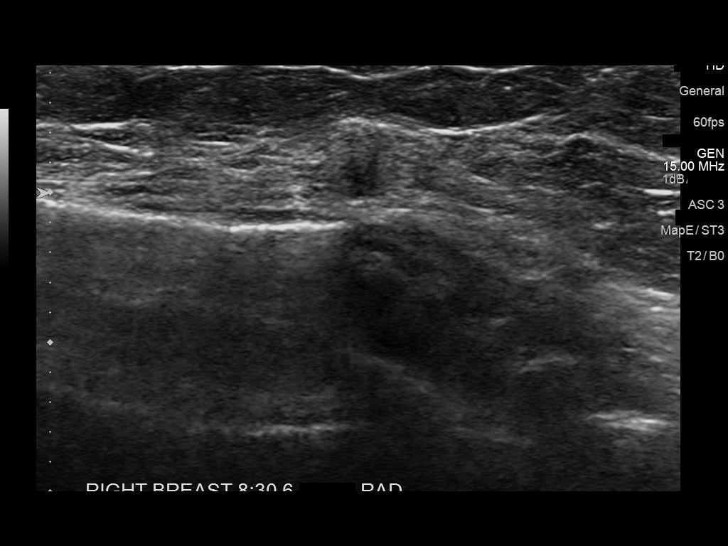
[im 3/15]
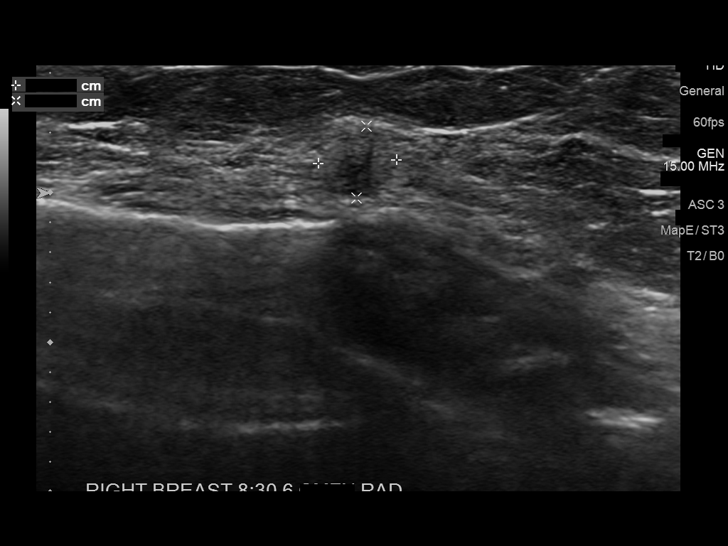
[im 5/15]
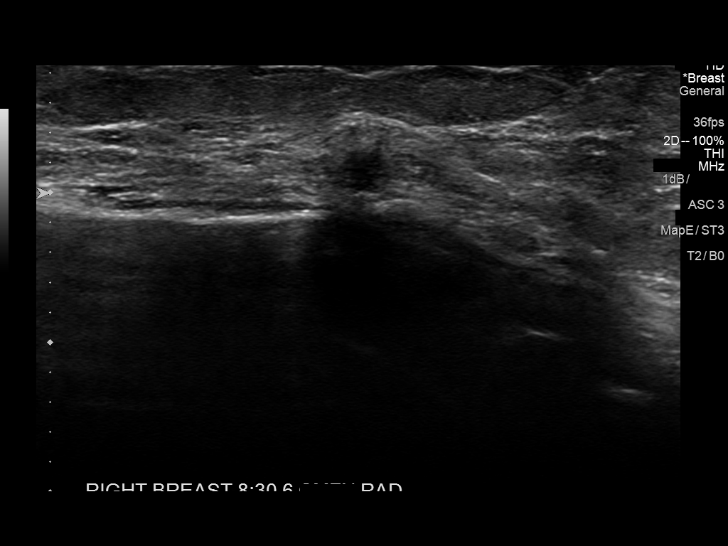
[im 6/15]
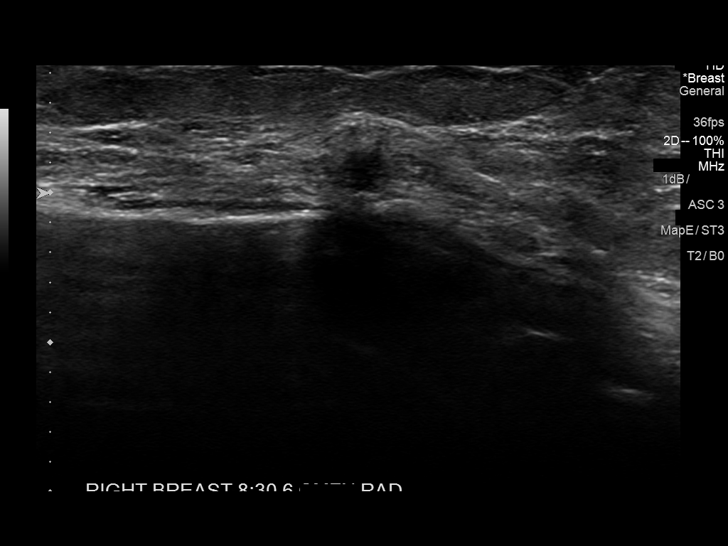
[im 7/15]
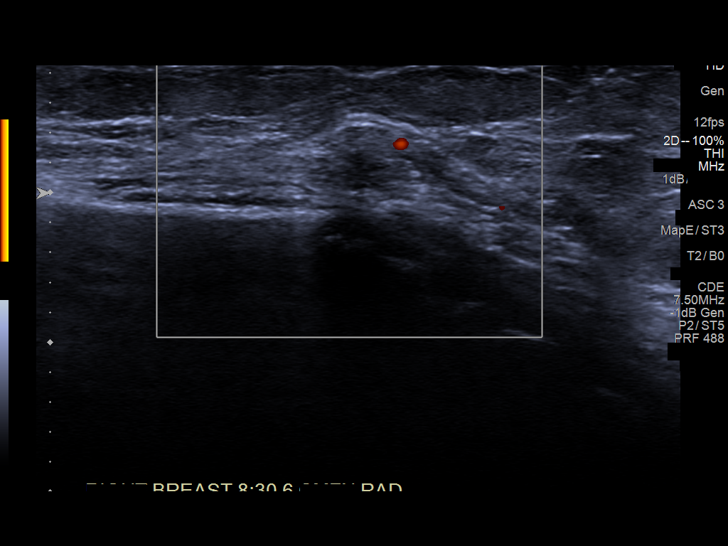
[im 8/15]
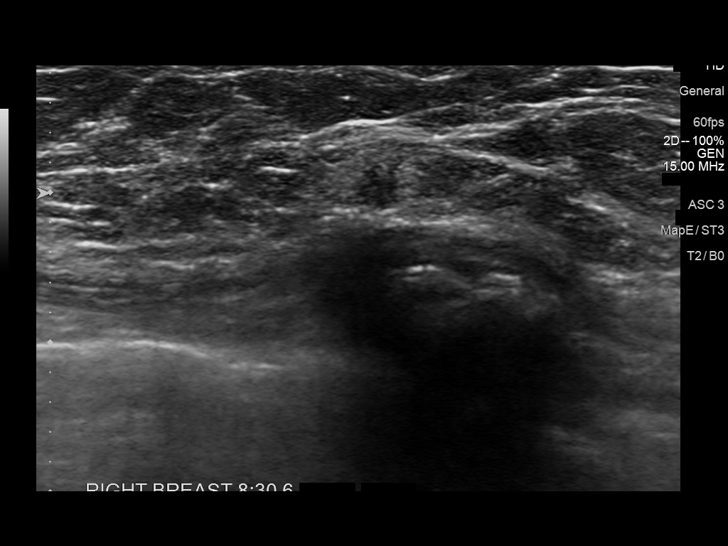
[im 9/15]
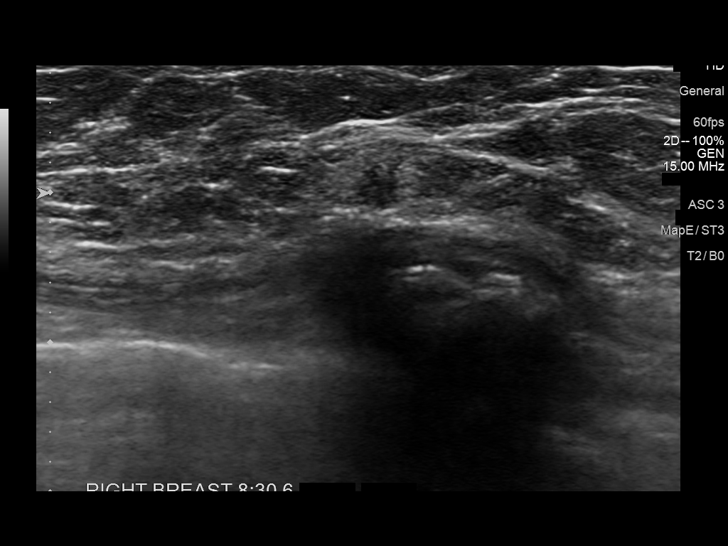
[im 10/15]
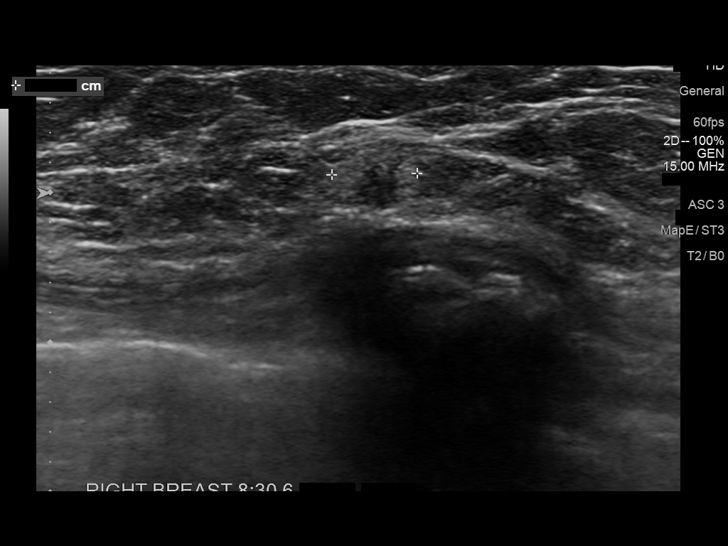
[im 11/15]
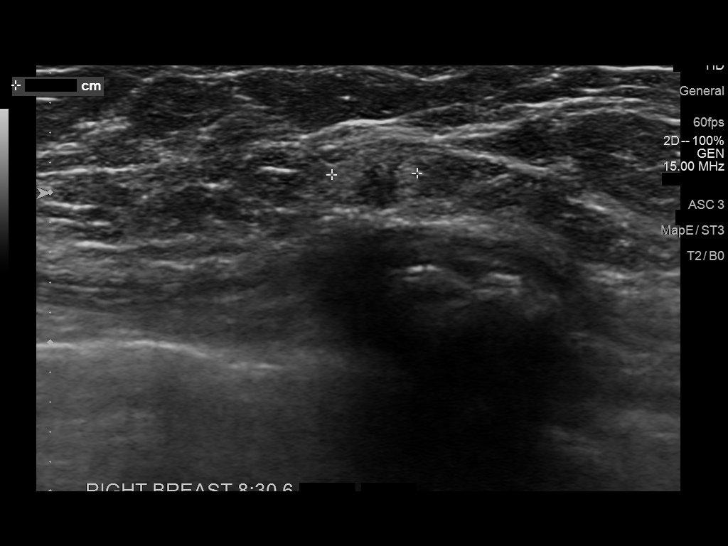
[im 13/15]
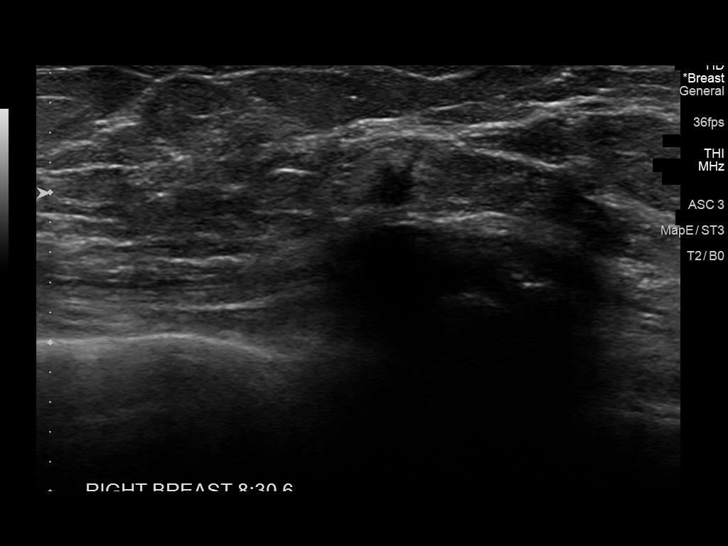
[im 14/15]
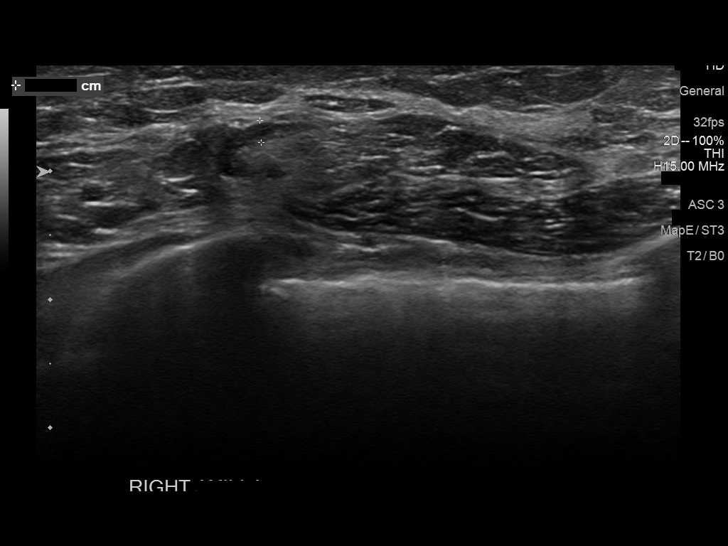
[im 15/15]
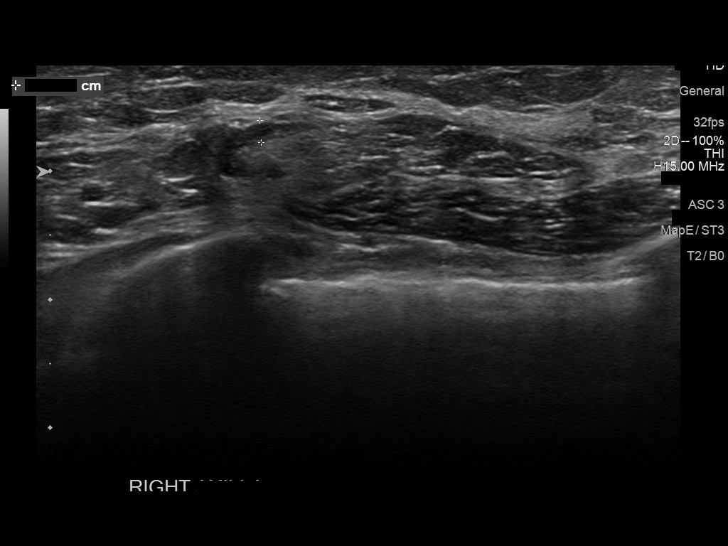

[13 of 15 positions shown; findings below may reference images not displayed]

ACR Breast Density Category b: There are scattered areas of
fibroglandular density.
FINDINGS: There is an approximately 6 mm irregular mass within the lower,
outer right breast, posterior depth with associated calcifications.
The mass and calcifications together span approximately 9 mm.

Mammographic images were processed with CAD.

Targeted ultrasound of the lateral right breast demonstrates an
irregular hypoechoic mass with surrounding hyperechogenicity
measuring 5 x 5 x 6 mm at [DATE], 6 cm from the nipple. Targeted
ultrasound of the right axilla demonstrates no suspicious appearing
axillary lymph nodes.
IMPRESSION: Indeterminate right breast mass at [DATE], 6 cm from the nipple.

RECOMMENDATION:
Ultrasound-guided right breast biopsy.

I have discussed the findings and recommendations with the patient.
Results were also provided in writing at the conclusion of the
visit. If applicable, a reminder letter will be sent to the patient
regarding the next appointment.

BI-RADS CATEGORY  4: Suspicious.

## 2017-09-05 ENCOUNTER — Ambulatory Visit
Admission: RE | Admit: 2017-09-05 | Discharge: 2017-09-05 | Disposition: A | Discharge status: Home / Self Care | Payer: Medicare Other | Source: Ambulatory Visit | Attending: Radiation Oncology | Admitting: Radiation Oncology

## 2017-09-05 ENCOUNTER — Encounter: Payer: Self-pay | Admitting: Radiation Oncology

## 2017-09-05 ENCOUNTER — Other Ambulatory Visit: Payer: Self-pay

## 2017-09-05 VITALS — BP 141/76 | HR 72 | Temp 96.3°F | Resp 18 | Wt 113.1 lb

## 2017-09-05 DIAGNOSIS — Z79810 Long term (current) use of selective estrogen receptor modulators (SERMs): Secondary | ICD-10-CM | POA: Insufficient documentation

## 2017-09-05 DIAGNOSIS — Z923 Personal history of irradiation: Secondary | ICD-10-CM | POA: Insufficient documentation

## 2017-09-05 DIAGNOSIS — C50411 Malignant neoplasm of upper-outer quadrant of right female breast: Secondary | ICD-10-CM | POA: Diagnosis present

## 2017-09-05 DIAGNOSIS — Z170 Estrogen receptor positive status [ER+]: Secondary | ICD-10-CM | POA: Insufficient documentation

## 2017-09-05 NOTE — Progress Notes (Signed)
Radiation Oncology Follow up Note  Name: Pamela Hensley   Date:   09/05/2017 MRN:  [id number] DOB: May 12, 1935    This 83 y.o. female presents to the clinic today for 6 month follow-up status post whole breast radiation to her right breast for stage I invasive mammary carcinoma.  REFERRING PROVIDER: Rusty Aus, MD  HPI: Patient is a 83 year old female now seen out 6 months having completed whole breast radiation to her right breast for stage I ER/PR positive invasive mammary carcinoma. Seen today in routine follow-up she is doing well. She specifically denies breast tenderness cough or bone pain. She's currently on tamoxifen tolerating that well without side effect.. She is scheduled for follow-up mammogram in April.  COMPLICATIONS OF TREATMENT: none  FOLLOW UP COMPLIANCE: keeps appointments   PHYSICAL EXAM:  BP (!) 141/76   Pulse 72   Temp (!) 96.3 F (35.7 C)   Resp 18   Wt 113 lb 1.5 oz (51.3 kg)   BMI 22.84 kg/m  Lungs are clear to A&P cardiac examination essentially unremarkable with regular rate and rhythm. No dominant mass or nodularity is noted in either breast in 2 positions examined. Incision is well-healed. No axillary or supraclavicular adenopathy is appreciated. Cosmetic result is excellent. Well-developed well-nourished patient in NAD. HEENT reveals PERLA, EOMI, discs not visualized.  Oral cavity is clear. No oral mucosal lesions are identified. Neck is clear without evidence of cervical or supraclavicular adenopathy. Lungs are clear to A&P. Cardiac examination is essentially unremarkable with regular rate and rhythm without murmur rub or thrill. Abdomen is benign with no organomegaly or masses noted. Motor sensory and DTR levels are equal and symmetric in the upper and lower extremities. Cranial nerves II through XII are grossly intact. Proprioception is intact. No peripheral adenopathy or edema is identified. No motor or sensory levels are noted. Crude visual fields are  within normal range.  RADIOLOGY RESULTS: No current films for review  PLAN: Present time she continues to do well with no evidence of disease. I've asked to see her back in 6 months for follow-up and then will start once your follow-up visits. She continues on tamoxifen without side effect. She or he has follow-up mammogram scheduled in April which I will review. Patient is to call with any concerns.  I would like to take this opportunity to thank you for allowing me to participate in the care of your patient.Noreene Filbert, MD

## 2017-10-14 ENCOUNTER — Ambulatory Visit
Admission: RE | Admit: 2017-10-14 | Discharge: 2017-10-14 | Disposition: A | Discharge status: Home / Self Care | Payer: Medicare Other | Source: Ambulatory Visit | Attending: Oncology | Admitting: Oncology

## 2017-10-14 ENCOUNTER — Other Ambulatory Visit: Payer: Self-pay | Admitting: Nurse Practitioner

## 2017-10-14 DIAGNOSIS — C50511 Malignant neoplasm of lower-outer quadrant of right female breast: Secondary | ICD-10-CM | POA: Diagnosis not present

## 2017-10-14 DIAGNOSIS — M810 Age-related osteoporosis without current pathological fracture: Secondary | ICD-10-CM

## 2017-10-14 HISTORY — DX: Personal history of irradiation: Z92.3

## 2017-10-17 ENCOUNTER — Other Ambulatory Visit: Payer: Self-pay | Admitting: *Deleted

## 2017-10-17 DIAGNOSIS — C50511 Malignant neoplasm of lower-outer quadrant of right female breast: Secondary | ICD-10-CM

## 2017-10-17 DIAGNOSIS — M810 Age-related osteoporosis without current pathological fracture: Secondary | ICD-10-CM

## 2017-10-17 MED ORDER — ALENDRONATE SODIUM 70 MG PO TABS: 70.0000 mg | ORAL_TABLET | ORAL | 3 refills | Status: AC

## 2017-10-17 MED ORDER — TAMOXIFEN CITRATE 20 MG PO TABS: 20.0000 mg | ORAL_TABLET | Freq: Every day | ORAL | 3 refills | Status: DC

## 2017-12-09 ENCOUNTER — Telehealth: Payer: Self-pay | Admitting: Oncology

## 2017-12-09 NOTE — Telephone Encounter (Signed)
Pt does not want to r\s at this time. Will call the office back to r\s.

## 2017-12-19 ENCOUNTER — Ambulatory Visit: Payer: Medicare Other | Admitting: Oncology

## 2018-03-13 ENCOUNTER — Ambulatory Visit
Admission: RE | Admit: 2018-03-13 | Discharge: 2018-03-13 | Disposition: A | Discharge status: Home / Self Care | Payer: Medicare Other | Source: Ambulatory Visit | Attending: Radiation Oncology | Admitting: Radiation Oncology

## 2018-03-13 ENCOUNTER — Other Ambulatory Visit: Payer: Self-pay

## 2018-03-13 ENCOUNTER — Encounter: Payer: Self-pay | Admitting: Radiation Oncology

## 2018-03-13 VITALS — BP 108/74 | HR 77 | Temp 97.2°F | Resp 16 | Wt 112.7 lb

## 2018-03-13 DIAGNOSIS — Z79810 Long term (current) use of selective estrogen receptor modulators (SERMs): Secondary | ICD-10-CM | POA: Diagnosis not present

## 2018-03-13 DIAGNOSIS — Z170 Estrogen receptor positive status [ER+]: Secondary | ICD-10-CM | POA: Diagnosis not present

## 2018-03-13 DIAGNOSIS — C50411 Malignant neoplasm of upper-outer quadrant of right female breast: Secondary | ICD-10-CM | POA: Insufficient documentation

## 2018-03-13 DIAGNOSIS — Z923 Personal history of irradiation: Secondary | ICD-10-CM | POA: Diagnosis not present

## 2018-03-13 NOTE — Progress Notes (Signed)
Radiation Oncology Follow up Note  Name: Pamela Hensley   Date:   03/13/2018 MRN:  [id number] DOB: 1935/01/25    This 83 y.o. female presents to the clinic today for 1 year follow-up status post whole breast radiation to her right breast for stage I invasive mammary carcinoma.  REFERRING PROVIDER: Rusty Aus, MD  HPI: patient is a an 83 year old female now out 1 year having completed whole breast radiation to her right breast for stage I invasive mammary carcinoma ER/PR positive. Seen today in routine follow-up she is doing well. She's currently on tamoxifen tolerating that well without side effect..she had mammograms back in April which I have reviewed were BI-RADS 2 benign.  COMPLICATIONS OF TREATMENT: none  FOLLOW UP COMPLIANCE: keeps appointments   PHYSICAL EXAM:  BP 108/74 (BP Location: Left Arm, Patient Position: Sitting)   Pulse 77   Temp (!) 97.2 F (36.2 C) (Tympanic)   Resp 16   Wt 112 lb 10.5 oz (51.1 kg)   BMI 22.75 kg/m  Lungs are clear to A&P cardiac examination essentially unremarkable with regular rate and rhythm. No dominant mass or nodularity is noted in either breast in 2 positions examined. Incision is well-healed. No axillary or supraclavicular adenopathy is appreciated. Cosmetic result is excellent.Well-developed well-nourished patient in NAD. HEENT reveals PERLA, EOMI, discs not visualized.  Oral cavity is clear. No oral mucosal lesions are identified. Neck is clear without evidence of cervical or supraclavicular adenopathy. Lungs are clear to A&P. Cardiac examination is essentially unremarkable with regular rate and rhythm without murmur rub or thrill. Abdomen is benign with no organomegaly or masses noted. Motor sensory and DTR levels are equal and symmetric in the upper and lower extremities. Cranial nerves II through XII are grossly intact. Proprioception is intact. No peripheral adenopathy or edema is identified. No motor or sensory levels are noted. Crude  visual fields are within normal range.  RADIOLOGY RESULTS: mammograms reviewed and compatible with the above-stated findings  PLAN: at the present time patient continues to do well with no evidence of disease. I'm please were overall progress. She or he has follow-up mammograms ordered for nextApril. She continues on tamoxifen without side effect. Patient knows to call with any concerns.  I would like to take this opportunity to thank you for allowing me to participate in the care of your patient.Noreene Filbert, MD

## 2018-05-26 ENCOUNTER — Other Ambulatory Visit: Payer: Self-pay

## 2018-05-26 ENCOUNTER — Inpatient Hospital Stay
Admission: EM | Admit: 2018-05-26 | Discharge: 2018-05-28 | DRG: 690 | Disposition: A | Discharge status: Home / Self Care | Payer: Medicare Other | Attending: Internal Medicine | Admitting: Internal Medicine

## 2018-05-26 ENCOUNTER — Encounter: Payer: Self-pay | Admitting: Emergency Medicine

## 2018-05-26 DIAGNOSIS — D631 Anemia in chronic kidney disease: Secondary | ICD-10-CM | POA: Diagnosis present

## 2018-05-26 DIAGNOSIS — Z923 Personal history of irradiation: Secondary | ICD-10-CM | POA: Diagnosis not present

## 2018-05-26 DIAGNOSIS — N183 Chronic kidney disease, stage 3 (moderate): Secondary | ICD-10-CM | POA: Diagnosis present

## 2018-05-26 DIAGNOSIS — Z90710 Acquired absence of both cervix and uterus: Secondary | ICD-10-CM | POA: Diagnosis not present

## 2018-05-26 DIAGNOSIS — E785 Hyperlipidemia, unspecified: Secondary | ICD-10-CM | POA: Diagnosis present

## 2018-05-26 DIAGNOSIS — I252 Old myocardial infarction: Secondary | ICD-10-CM

## 2018-05-26 DIAGNOSIS — M810 Age-related osteoporosis without current pathological fracture: Secondary | ICD-10-CM | POA: Diagnosis present

## 2018-05-26 DIAGNOSIS — Z853 Personal history of malignant neoplasm of breast: Secondary | ICD-10-CM | POA: Diagnosis not present

## 2018-05-26 DIAGNOSIS — R296 Repeated falls: Secondary | ICD-10-CM | POA: Diagnosis present

## 2018-05-26 DIAGNOSIS — Z7984 Long term (current) use of oral hypoglycemic drugs: Secondary | ICD-10-CM | POA: Diagnosis not present

## 2018-05-26 DIAGNOSIS — Z7982 Long term (current) use of aspirin: Secondary | ICD-10-CM

## 2018-05-26 DIAGNOSIS — E860 Dehydration: Secondary | ICD-10-CM | POA: Diagnosis present

## 2018-05-26 DIAGNOSIS — Z79891 Long term (current) use of opiate analgesic: Secondary | ICD-10-CM

## 2018-05-26 DIAGNOSIS — I482 Chronic atrial fibrillation, unspecified: Secondary | ICD-10-CM | POA: Diagnosis present

## 2018-05-26 DIAGNOSIS — E1122 Type 2 diabetes mellitus with diabetic chronic kidney disease: Secondary | ICD-10-CM | POA: Diagnosis present

## 2018-05-26 DIAGNOSIS — I129 Hypertensive chronic kidney disease with stage 1 through stage 4 chronic kidney disease, or unspecified chronic kidney disease: Secondary | ICD-10-CM | POA: Diagnosis present

## 2018-05-26 DIAGNOSIS — Z79899 Other long term (current) drug therapy: Secondary | ICD-10-CM

## 2018-05-26 DIAGNOSIS — F329 Major depressive disorder, single episode, unspecified: Secondary | ICD-10-CM | POA: Diagnosis present

## 2018-05-26 DIAGNOSIS — N179 Acute kidney failure, unspecified: Secondary | ICD-10-CM | POA: Diagnosis present

## 2018-05-26 DIAGNOSIS — Z9011 Acquired absence of right breast and nipple: Secondary | ICD-10-CM

## 2018-05-26 DIAGNOSIS — N390 Urinary tract infection, site not specified: Principal | ICD-10-CM | POA: Diagnosis present

## 2018-05-26 DIAGNOSIS — Z7901 Long term (current) use of anticoagulants: Secondary | ICD-10-CM | POA: Diagnosis not present

## 2018-05-26 DIAGNOSIS — E162 Hypoglycemia, unspecified: Secondary | ICD-10-CM

## 2018-05-26 DIAGNOSIS — F419 Anxiety disorder, unspecified: Secondary | ICD-10-CM | POA: Diagnosis present

## 2018-05-26 DIAGNOSIS — E11649 Type 2 diabetes mellitus with hypoglycemia without coma: Secondary | ICD-10-CM | POA: Diagnosis present

## 2018-05-26 DIAGNOSIS — W19XXXA Unspecified fall, initial encounter: Secondary | ICD-10-CM

## 2018-05-26 DIAGNOSIS — I2510 Atherosclerotic heart disease of native coronary artery without angina pectoris: Secondary | ICD-10-CM | POA: Diagnosis present

## 2018-05-26 LAB — BASIC METABOLIC PANEL
Anion gap: 9 (ref 5–15)
BUN: 58 mg/dL — ABNORMAL HIGH (ref 8–23)
CALCIUM: 8.8 mg/dL — AB (ref 8.9–10.3)
CO2: 26 mmol/L (ref 22–32)
Chloride: 100 mmol/L (ref 98–111)
Creatinine, Ser: 2.53 mg/dL — ABNORMAL HIGH (ref 0.44–1.00)
GFR calc non Af Amer: 17 mL/min — ABNORMAL LOW (ref >60)
GFR, EST AFRICAN AMERICAN: 19 mL/min — AB (ref >60)
GLUCOSE: 91 mg/dL (ref 70–99)
POTASSIUM: 4.9 mmol/L (ref 3.5–5.1)
Sodium: 135 mmol/L (ref 135–145)

## 2018-05-26 LAB — CBC WITH DIFFERENTIAL/PLATELET
ABS IMMATURE GRANULOCYTES: 0.11 10*3/uL — AB (ref 0.00–0.07)
BASOS ABS: 0 10*3/uL (ref 0.0–0.1)
Basophils Relative: 0 %
Eosinophils Absolute: 0 10*3/uL (ref 0.0–0.5)
Eosinophils Relative: 0 %
HEMATOCRIT: 36.6 % (ref 36.0–46.0)
HEMOGLOBIN: 11.7 g/dL — AB (ref 12.0–15.0)
IMMATURE GRANULOCYTES: 1 %
LYMPHS ABS: 1.2 10*3/uL (ref 0.7–4.0)
LYMPHS PCT: 9 %
MCH: 29.7 pg (ref 26.0–34.0)
MCHC: 32 g/dL (ref 30.0–36.0)
MCV: 92.9 fL (ref 80.0–100.0)
MONOS PCT: 7 %
Monocytes Absolute: 0.9 10*3/uL (ref 0.1–1.0)
NEUTROS ABS: 11.5 10*3/uL — AB (ref 1.7–7.7)
NEUTROS PCT: 83 %
NRBC: 0 % (ref 0.0–0.2)
Platelets: 151 10*3/uL (ref 150–400)
RBC: 3.94 MIL/uL (ref 3.87–5.11)
RDW: 13.5 % (ref 11.5–15.5)
WBC: 13.8 10*3/uL — ABNORMAL HIGH (ref 4.0–10.5)

## 2018-05-26 LAB — URINALYSIS, COMPLETE (UACMP) WITH MICROSCOPIC
BILIRUBIN URINE: NEGATIVE
GLUCOSE, UA: NEGATIVE mg/dL
HGB URINE DIPSTICK: NEGATIVE
Ketones, ur: NEGATIVE mg/dL
NITRITE: NEGATIVE
PROTEIN: 100 mg/dL — AB
SPECIFIC GRAVITY, URINE: 1.008 (ref 1.005–1.030)
pH: 6 (ref 5.0–8.0)

## 2018-05-26 LAB — TSH: TSH: 2.98 u[IU]/mL (ref 0.350–4.500)

## 2018-05-26 LAB — GLUCOSE, CAPILLARY
GLUCOSE-CAPILLARY: 114 mg/dL — AB (ref 70–99)
Glucose-Capillary: 124 mg/dL — ABNORMAL HIGH (ref 70–99)
Glucose-Capillary: 200 mg/dL — ABNORMAL HIGH (ref 70–99)
Glucose-Capillary: 165 mg/dL — ABNORMAL HIGH (ref 70–99)

## 2018-05-26 LAB — HEMOGLOBIN A1C
Hgb A1c MFr Bld: 6.2 % — ABNORMAL HIGH (ref 4.8–5.6)
Mean Plasma Glucose: 131.24 mg/dL

## 2018-05-26 LAB — PROTIME-INR
INR: 4
PROTHROMBIN TIME: 38.4 s — AB (ref 11.4–15.2)

## 2018-05-26 LAB — TROPONIN I: Troponin I: <0.03 ng/mL (ref <0.03)

## 2018-05-26 MED ORDER — METOPROLOL SUCCINATE ER 25 MG PO TB24
25.0000 mg | ORAL_TABLET | Freq: Every day | ORAL | Status: DC
  Administered 2018-05-26 – 2018-05-28 (×3): 25 mg via ORAL
  Filled 2018-05-26 (×3): qty 1

## 2018-05-26 MED ORDER — DOCUSATE SODIUM 100 MG PO CAPS
100.0000 mg | ORAL_CAPSULE | Freq: Two times a day (BID) | ORAL | Status: DC
  Administered 2018-05-26 – 2018-05-28 (×5): 100 mg via ORAL
  Filled 2018-05-26 (×5): qty 1

## 2018-05-26 MED ORDER — DILTIAZEM HCL ER COATED BEADS 120 MG PO CP24
120.0000 mg | ORAL_CAPSULE | Freq: Every day | ORAL | Status: DC
  Administered 2018-05-26 – 2018-05-28 (×3): 120 mg via ORAL
  Filled 2018-05-26 (×3): qty 1

## 2018-05-26 MED ORDER — VENLAFAXINE HCL ER 37.5 MG PO CP24
37.5000 mg | ORAL_CAPSULE | Freq: Every day | ORAL | Status: DC
  Administered 2018-05-26 – 2018-05-28 (×3): 37.5 mg via ORAL
  Filled 2018-05-26 (×3): qty 1

## 2018-05-26 MED ORDER — ALENDRONATE SODIUM 70 MG PO TABS: 70.0000 mg | ORAL_TABLET | ORAL | Status: DC

## 2018-05-26 MED ORDER — ONDANSETRON HCL 4 MG/2ML IJ SOLN: 4.0000 mg | Freq: Four times a day (QID) | INTRAMUSCULAR | Status: DC | PRN

## 2018-05-26 MED ORDER — SODIUM CHLORIDE 0.9 % IV BOLUS
1000.0000 mL | Freq: Once | INTRAVENOUS | Status: AC
  Administered 2018-05-26: 1000 mL via INTRAVENOUS

## 2018-05-26 MED ORDER — INSULIN ASPART 100 UNIT/ML ~~LOC~~ SOLN: 0.0000 [IU] | Freq: Every day | SUBCUTANEOUS | Status: DC

## 2018-05-26 MED ORDER — ATORVASTATIN CALCIUM 20 MG PO TABS
20.0000 mg | ORAL_TABLET | Freq: Every day | ORAL | Status: DC
  Administered 2018-05-26 – 2018-05-27 (×2): 20 mg via ORAL
  Filled 2018-05-26 (×2): qty 1

## 2018-05-26 MED ORDER — SIMVASTATIN 20 MG PO TABS: 40.0000 mg | ORAL_TABLET | Freq: Every day | ORAL | Status: DC

## 2018-05-26 MED ORDER — SODIUM CHLORIDE 0.9 % IV SOLN
1.0000 g | INTRAVENOUS | Status: AC
  Administered 2018-05-26: 1 g via INTRAVENOUS
  Filled 2018-05-26: qty 10

## 2018-05-26 MED ORDER — ASPIRIN EC 81 MG PO TBEC
81.0000 mg | DELAYED_RELEASE_TABLET | Freq: Every day | ORAL | Status: DC
  Administered 2018-05-26 – 2018-05-28 (×3): 81 mg via ORAL
  Filled 2018-05-26 (×3): qty 1

## 2018-05-26 MED ORDER — TAMOXIFEN CITRATE 10 MG PO TABS
20.0000 mg | ORAL_TABLET | Freq: Every day | ORAL | Status: DC
  Administered 2018-05-26 – 2018-05-28 (×3): 20 mg via ORAL
  Filled 2018-05-26 (×3): qty 2

## 2018-05-26 MED ORDER — SODIUM CHLORIDE 0.9 % IV SOLN
INTRAVENOUS | Status: DC
  Administered 2018-05-26 – 2018-05-28 (×5): via INTRAVENOUS

## 2018-05-26 MED ORDER — INSULIN ASPART 100 UNIT/ML ~~LOC~~ SOLN
0.0000 [IU] | Freq: Three times a day (TID) | SUBCUTANEOUS | Status: DC
  Administered 2018-05-26: 13:00:00 1 [IU] via SUBCUTANEOUS
  Administered 2018-05-26: 18:00:00 2 [IU] via SUBCUTANEOUS
  Administered 2018-05-27: 17:00:00 7 [IU] via SUBCUTANEOUS
  Administered 2018-05-27: 2 [IU] via SUBCUTANEOUS
  Administered 2018-05-28: 13:00:00 5 [IU] via SUBCUTANEOUS
  Administered 2018-05-28: 2 [IU] via SUBCUTANEOUS
  Filled 2018-05-26 (×6): qty 1

## 2018-05-26 MED ORDER — VITAMIN D 25 MCG (1000 UNIT) PO TABS
1000.0000 [IU] | ORAL_TABLET | Freq: Every day | ORAL | Status: DC
  Administered 2018-05-26 – 2018-05-28 (×3): 1000 [IU] via ORAL
  Filled 2018-05-26 (×3): qty 1

## 2018-05-26 MED ORDER — SOTALOL HCL 80 MG PO TABS
80.0000 mg | ORAL_TABLET | Freq: Two times a day (BID) | ORAL | Status: DC
  Administered 2018-05-26 – 2018-05-28 (×5): 80 mg via ORAL
  Filled 2018-05-26 (×6): qty 1

## 2018-05-26 MED ORDER — WARFARIN - PHARMACIST DOSING INPATIENT
Freq: Every day | Status: DC
  Administered 2018-05-27: 17:00:00

## 2018-05-26 MED ORDER — ACETAMINOPHEN 650 MG RE SUPP: 650.0000 mg | Freq: Four times a day (QID) | RECTAL | Status: DC | PRN

## 2018-05-26 MED ORDER — DIGOXIN 125 MCG PO TABS
0.1250 mg | ORAL_TABLET | Freq: Every day | ORAL | Status: DC
  Administered 2018-05-26 – 2018-05-28 (×3): 0.125 mg via ORAL
  Filled 2018-05-26 (×3): qty 1

## 2018-05-26 MED ORDER — HYDROCHLOROTHIAZIDE 25 MG PO TABS
25.0000 mg | ORAL_TABLET | Freq: Every day | ORAL | Status: DC
  Administered 2018-05-26 – 2018-05-28 (×2): 25 mg via ORAL
  Filled 2018-05-26 (×3): qty 1

## 2018-05-26 MED ORDER — ONDANSETRON HCL 4 MG PO TABS: 4.0000 mg | ORAL_TABLET | Freq: Four times a day (QID) | ORAL | Status: DC | PRN

## 2018-05-26 MED ORDER — SODIUM CHLORIDE 0.9 % IV SOLN
1.0000 g | INTRAVENOUS | Status: DC
  Administered 2018-05-27 – 2018-05-28 (×2): 1 g via INTRAVENOUS
  Filled 2018-05-26 (×2): qty 1

## 2018-05-26 MED ORDER — ACETAMINOPHEN 325 MG PO TABS: 650.0000 mg | ORAL_TABLET | Freq: Four times a day (QID) | ORAL | Status: DC | PRN

## 2018-05-26 MED ORDER — WARFARIN - PHARMACIST DOSING INPATIENT: Freq: Every day | Status: DC

## 2018-05-26 NOTE — Progress Notes (Signed)
   05/26/18 1405  Clinical Encounter Type  Visited With Patient and family together  Visit Type Initial;Spiritual support  Referral From Nurse  Consult/Referral To Chaplain  Spiritual Encounters  Spiritual Needs Other (Comment)   [place] received an OR to educate the patient on the AD process. The patient stated that she was not interested in an AD. The OR will be closed out.

## 2018-05-26 NOTE — Progress Notes (Signed)
ANTICOAGULATION CONSULT NOTE - Initial Consult  Pharmacy Consult for Warfarin Indication: atrial fibrillation  No Known Allergies  Patient Measurements: Height: 4\' 11"  (149.9 cm) Weight: 112 lb (50.8 kg) IBW/kg (Calculated) : 43.2 Heparin Dosing Weight:    Vital Signs: Temp: 98.6 F (37 C) (11/18 0846) Temp Source: Oral (11/18 0846) BP: 134/73 (11/18 0846) Pulse Rate: 63 (11/18 0846)  Labs: Recent Labs    05/26/18 0459 05/26/18 0525  HGB 11.7*  --   HCT 36.6  --   PLT 151  --   LABPROT  --  38.4*  INR  --  4.00  CREATININE 2.53*  --   TROPONINI <0.03  --     Estimated Creatinine Clearance: 11.9 mL/min (A) (by C-G formula based on SCr of 2.53 mg/dL (H)).   Medical History: Past Medical History:  Diagnosis Date  . Anemia   . Anxiety   . Arthritis    osteoarthritis  . Atrial fibrillation ([place])   . Cancer ([facility])    Breast  . Coronary artery disease   . Diabetes mellitus without complication ([place])   . Hyperlipidemia   . Hypertension   . Myocardial infarction ([place]) 08/05  . Osteoporosis   . Personal history of radiation therapy     Medications:  Scheduled:  . alendronate  70 mg Oral Weekly  . aspirin  81 mg Oral Daily  . cholecalciferol  1,000 Units Oral Daily  . digoxin  0.125 mg Oral Daily  . diltiazem  120 mg Oral Daily  . docusate sodium  100 mg Oral BID  . hydrochlorothiazide  25 mg Oral Daily  . insulin aspart  0-5 Units Subcutaneous QHS  . insulin aspart  0-9 Units Subcutaneous TID WC  . metoprolol succinate  25 mg Oral Daily  . simvastatin  40 mg Oral QHS  . sotalol  80 mg Oral BID  . tamoxifen  20 mg Oral Daily  . venlafaxine XR  37.5 mg Oral Q breakfast  . Warfarin - Pharmacist Dosing Inpatient   Does not apply q1800    Assessment: 83 yo F on Warfarin PTA for Atrial Fibrillation.  Home dose= 4.5 mg daily (per Med Rec). Hgb 11.7  Plt 151  11/18 INR 4.00  Hold Warfarin   Goal of Therapy:  INR 2-3 Monitor platelets by  anticoagulation protocol: Yes   Plan:  INR supratherapeutic. Will Hold Warfarin dose today.  Patient on antibiotics. Will f/u INR daily.  Kc Summerson A 05/26/2018,8:50 AM

## 2018-05-26 NOTE — Progress Notes (Signed)
PHARMACIST - PHYSICIAN COMMUNICATION  CONCERNING: P&T Medication Policy Regarding Oral Bisphosphonates  RECOMMENDATION: Your order for alendronate (Fosamax), ibandronate (Boniva), or risedronate (Actonel) has been discontinued at this time.  If the patient's post-hospital medical condition warrants safe use of this class of drugs, please resume the pre-hospital regimen upon discharge.  DESCRIPTION:  Alendronate (Fosamax), ibandronate (Boniva), and risedronate (Actonel) can cause severe esophageal erosions in patients who are unable to remain upright at least 30 minutes after taking this medication.   Since brief interruptions in therapy are thought to have minimal impact on bone mineral density, the [facility] has established that bisphosphonate orders should be routinely discontinued during hospitalization.   To override this safety policy and permit administration of Boniva, Fosamax, or Actonel in the hospital, prescribers must write "DO NOT HOLD" in the comments section when placing the order for this class of medications.   Prudy Feeler, RPh 05/26/2018 8:53 AM

## 2018-05-26 NOTE — Progress Notes (Signed)
Patient admitted this morning by Dr. Marcille Blanco for acute kidney injury.  Agree with admitting MD plan

## 2018-05-26 NOTE — ED Triage Notes (Signed)
Pt arrives via [facility] with c/o hypoglycemia. Per EMS pt is from home and her husband took pt's CBG at home and got 45. EMS reports that pt had CBG with them of 70. Pt is alert and oriented at this time at baseline and appears in NAD.

## 2018-05-26 NOTE — Progress Notes (Signed)
Family Meeting Note  Advance Directive:no  Today a meeting took place with the Patient.daughter  The following clinical team members were present during this meeting:MD  The following were discussed:Patient's diagnosis: renal injury, Patient's progosis: > 12 months and Goals for treatment: DNR  Additional follow-up to be provided: FULL CODE Chaplain consult advanced directive information  Time spent during discussion:16 minutes  Pamela Shells, MD

## 2018-05-26 NOTE — Progress Notes (Signed)
PHARMACIST - PHYSICIAN COMMUNICATION  CONCERNING: DDI between diltiazem and simvastatin  RECOMMENDATION: Your order for simvastatin 40mg  has been changed to atorvastatin 20mg   If the patient's post-hospital medical condition warrants safe use of this class of drugs, please resume the pre-hospital regimen upon discharge.  DESCRIPTION:  Patients on diltiazem and simvastatin at >10 mg/day have reported cases of rhabdomyolysis. Simvastatin has been changed to the equivalent dose of atorvastatin while inpatient to avoid this risk.    Vallery Sa, PharmD

## 2018-05-26 NOTE — ED Provider Notes (Signed)
[facility] Emergency Department Provider Note  ____________________________________________   First MD Initiated Contact with Patient 05/26/18 [phone]     (approximate)  I have reviewed the triage vital signs and the nursing notes.   HISTORY  Chief Complaint Hypoglycemia and Fall    HPI Pamela Hensley is a 83 y.o. female with medical history as listed below which notably includes atrial fibrillation on long-term anticoagulation as well as diabetes on metformin but no insulin.  She presents by EMS for evaluation of hypoglycemia.  She and her family report that this weekend she has had a difficult time at night with her blood sugar dropping too low.  She eats a little bit before bed but then when she wakes up in the middle the night to go use the restroom and she feels very lightheaded and dizzy, often shaky and sometimes warm.  She has had 2 falls on 2 separate evenings, not severe falls resulting in injury but where she is weak and slides to the floor.  Her husband has been checking her fingerstick blood glucose and each time found that her blood sugar was in the 40s.  It always improves and her symptoms resolve after eating something.  Tonight she slipped down the wall of the restroom onto her buttocks on the floor and he checked her blood sugar and it was in the 30s.  He had her drink some juice and eat some bread and EMS reports that the fingerstick blood sugar was about 70.  The patient currently denies any symptoms.  She denies headache, neck pain, chest pain or shortness of breath, nausea, vomiting, abdominal pain, and dysuria.  Her husband mentioned that he was concerned she might have a urinary tract infection but it is unclear why he was concerned about that.  The onset of the symptoms is relatively acute and severe but she feels much better now after having something to eat prior to arrival.  Past Medical History:  Diagnosis Date  . Anemia   . Anxiety   .  Arthritis    osteoarthritis  . Atrial fibrillation ([place])   . Cancer (HCC)    Breast  . Coronary artery disease   . Diabetes mellitus without complication ([place])   . Hyperlipidemia   . Hypertension   . Myocardial infarction ([place]) 08/05  . Osteoporosis   . Personal history of radiation therapy     Patient Active Problem List   Diagnosis Date Noted  . Anemia 01/28/2017  . Thrombocytopenia ([place]) 01/28/2017  . Hyponatremia 01/28/2017  . Bradycardia 01/28/2017  . Closed left hip fracture ([place]) 01/25/2017  . CKD (chronic kidney disease), stage III ([place]) 11/30/2016  . Benign essential hypertension 11/13/2016  . Primary cancer of lower-outer quadrant of right breast ([place]) 11/11/2016  . Malignant neoplasm of lower-outer quadrant of right female breast ([place]) 10/25/2016  . Age-related osteoporosis without current pathological fracture 08/27/2016  . Chronic atrial fibrillation 08/27/2016  . Medicare annual wellness visit, initial 08/27/2016  . Hyperlipidemia, mixed 02/23/2016  . Controlled type 2 diabetes mellitus without complication, without long-term current use of insulin ([place]) 08/24/2015  . CAD (coronary artery disease) 11/11/2013  . OA (osteoarthritis) 11/11/2013    Past Surgical History:  Procedure Laterality Date  . ABDOMINAL HYSTERECTOMY    . APPENDECTOMY    . BLADDER SURGERY    . BREAST BIOPSY Right 10/22/2016   US guided biopsy (+)  . BREAST LUMPECTOMY Right 12/13/2016  . COLON SURGERY  10/99  hemi-colectomy  . HIP PINNING,CANNULATED Left 01/26/2017   Procedure: CANNULATED HIP PINNING;  Surgeon: Thornton Park, MD;  Location: [facility];  Service: Orthopedics;  Laterality: Left;  Marland Kitchen MASS EXCISION Right 04/15/2015   Procedure: MINOR EXCISION OF MASS INCLUSION RIGHT NECK CYST;  Surgeon: Beverly Gust, MD;  Location: [place];  Service: ENT;  Laterality: Right;  ** local only **  . PARTIAL MASTECTOMY WITH NEEDLE LOCALIZATION Right 12/13/2016   Procedure: PARTIAL  MASTECTOMY WITH NEEDLE LOCALIZATION;  Surgeon: Leonie Green, MD;  Location: [facility];  Service: General;  Laterality: Right;  . SENTINEL NODE BIOPSY Right 12/13/2016   Procedure: SENTINEL NODE BIOPSY;  Surgeon: Leonie Green, MD;  Location: [facility];  Service: General;  Laterality: Right;    Prior to Admission medications   Medication Sig Start Date End Date Taking? Authorizing Provider  alendronate (FOSAMAX) 70 MG tablet Take 1 tablet (70 mg total) by mouth once a week. Take with a full glass of water on an empty stomach. 10/17/17  Yes Lloyd Huger, MD  aspirin 81 MG tablet Take 81 mg by mouth daily.   Yes [provider]  cholecalciferol (VITAMIN D3) 25 MCG (1000 UT) tablet Take 1,000 Units by mouth daily.   Yes [provider]  digoxin (LANOXIN) 0.125 MG tablet Take 0.125 mg by mouth daily.    Yes [provider]  diltiazem (CARDIZEM CD) 120 MG 24 hr capsule Take 120 mg by mouth daily.  07/23/17  Yes [provider]  glimepiride (AMARYL) 2 MG tablet Take 2 mg by mouth daily.  08/22/16  Yes [provider]  hydrochlorothiazide (HYDRODIURIL) 25 MG tablet Take 25 mg by mouth daily.    Yes [provider]  metFORMIN (GLUCOPHAGE) 500 MG tablet Take 250 mg by mouth 2 (two) times daily with a meal.    Yes [provider]  metoprolol succinate (TOPROL-XL) 50 MG 24 hr tablet Take 25 mg by mouth daily.  04/01/18  Yes [provider]  simvastatin (ZOCOR) 40 MG tablet Take 40 mg by mouth at bedtime.    Yes [provider]  sotalol (BETAPACE) 80 MG tablet Take 80 mg by mouth 2 (two) times daily.    Yes [provider]  tamoxifen (NOLVADEX) 20 MG tablet Take 1 tablet (20 mg total) by mouth daily. 10/17/17  Yes Lloyd Huger, MD  traMADol (ULTRAM) 50 MG tablet Take 50 mg by mouth 2 (two) times daily as needed for pain. 05/19/18  Yes [provider]  venlafaxine XR (EFFEXOR-XR) 37.5 MG 24 hr  capsule Take 37.5 mg by mouth daily with breakfast.   Yes [provider]  warfarin (COUMADIN) 3 MG tablet Take 4.5 mg by mouth at bedtime.    Yes [provider]  Calcium Carbonate-Vitamin D (CALCIUM 600+D) 600-400 MG-UNIT tablet Take 2 tablets by mouth daily. Patient not taking: Reported on 05/26/2018 02/20/17   Verlon Au, NP  docusate sodium (COLACE) 100 MG capsule Take 1 capsule (100 mg total) by mouth 2 (two) times daily. Patient not taking: Reported on 05/26/2018 01/28/17   Theodoro Grist, MD  HYDROcodone-acetaminophen (NORCO/VICODIN) 5-325 MG tablet Take 1-2 tablets by mouth every 6 (six) hours as needed for moderate pain. Patient not taking: Reported on 09/05/2017 01/28/17   Theodoro Grist, MD  methocarbamol (ROBAXIN) 500 MG tablet Take 1 tablet (500 mg total) by mouth every 6 (six) hours as needed for muscle spasms. Patient not taking: Reported on 05/26/2018 01/28/17  Theodoro Grist, MD  metoprolol succinate (TOPROL-XL) 25 MG 24 hr tablet Take 0.5 tablets (12.5 mg total) by mouth every morning. Patient not taking: Reported on 05/26/2018 01/29/17   Theodoro Grist, MD  polyethylene glycol (MIRALAX / GLYCOLAX) packet Take 17 g by mouth daily as needed for mild constipation. Patient not taking: Reported on 05/26/2018 01/28/17   Theodoro Grist, MD  senna (SENOKOT) 8.6 MG TABS tablet Take 1 tablet (8.6 mg total) by mouth 2 (two) times daily. Patient not taking: Reported on 05/26/2018 01/28/17   Theodoro Grist, MD    Allergies Patient has no known allergies.  Family History  Problem Relation Age of Onset  . Breast cancer Neg Hx     Social History Social History   Tobacco Use  . Smoking status: Never Smoker  . Smokeless tobacco: Never Used  Substance Use Topics  . Alcohol use: No  . Drug use: No    Review of Systems Constitutional: No fever/chills.  Felt lightheaded, dizzy, and weak when she was hypoglycemic. Eyes: No visual changes. ENT: No sore  throat. Cardiovascular: Denies chest pain. Respiratory: Denies shortness of breath. Gastrointestinal: No abdominal pain.  No nausea, no vomiting.  No diarrhea.  No constipation. Genitourinary: Negative for dysuria. Musculoskeletal: Negative for neck pain.  Negative for back pain. Integumentary: Negative for rash. Neurological: Negative for headaches, focal weakness or numbness.   ____________________________________________   PHYSICAL EXAM:  VITAL SIGNS: ED Triage Vitals  Enc Vitals Group     BP 05/26/18 0437 (!) 157/86     Pulse Rate 05/26/18 0437 92     Resp 05/26/18 0437 18     Temp 05/26/18 0437 97.9 F (36.6 C)     Temp Source 05/26/18 0437 Oral     SpO2 05/26/18 0437 99 %     Weight 05/26/18 0433 50.8 kg (112 lb)     Height 05/26/18 0433 1.499 m (4\' 11" )     Head Circumference --      Peak Flow --      Pain Score 05/26/18 0433 0     Pain Loc --      Pain Edu? --      Excl. in [place]? --     Constitutional: Alert and oriented. Well appearing and in no acute distress. Eyes: Conjunctivae are normal.  Head: Atraumatic. Nose: No congestion/rhinnorhea. Mouth/Throat: Mucous membranes are moist. Neck: No stridor.  No meningeal signs.  No cervical spine tenderness to palpation.  No pain or tenderness with flexion and extension nor rotation of her head and neck. Cardiovascular: Normal rate, regular rhythm. Good peripheral circulation. Grossly normal heart sounds. Respiratory: Normal respiratory effort.  No retractions. Lungs CTAB. Gastrointestinal: Soft and nontender. No distention.  Musculoskeletal: No lower extremity tenderness nor edema. No gross deformities of extremities.  I was able to range her arms and legs without any reproducible pain. Neurologic:  Normal speech and language. No gross focal neurologic deficits are appreciated.  Skin:  Skin is warm, dry and intact. No rash noted. Psychiatric: Mood and affect are normal. Speech and behavior are  normal.  ____________________________________________   LABS (all labs ordered are listed, but only abnormal results are displayed)  Labs Reviewed  GLUCOSE, CAPILLARY - Abnormal; Notable for the following components:      Result Value   Glucose-Capillary 114 (*)    All other components within normal limits  CBC WITH DIFFERENTIAL/PLATELET - Abnormal; Notable for the following components:   WBC 13.8 (*)    Hemoglobin 11.7 (*)  Neutro Abs 11.5 (*)    Abs Immature Granulocytes 0.11 (*)    All other components within normal limits  BASIC METABOLIC PANEL - Abnormal; Notable for the following components:   BUN 58 (*)    Creatinine, Ser 2.53 (*)    Calcium 8.8 (*)    GFR calc non Af Amer 17 (*)    GFR calc Af Amer 19 (*)    All other components within normal limits  URINALYSIS, COMPLETE (UACMP) WITH MICROSCOPIC - Abnormal; Notable for the following components:   Color, Urine YELLOW (*)    APPearance HAZY (*)    Protein, ur 100 (*)    Leukocytes, UA SMALL (*)    WBC, UA >50 (*)    Bacteria, UA RARE (*)    All other components within normal limits  PROTIME-INR - Abnormal; Notable for the following components:   Prothrombin Time 38.4 (*)    All other components within normal limits  URINE CULTURE  TROPONIN I  URINALYSIS, ROUTINE W REFLEX MICROSCOPIC  CBG MONITORING, ED   ____________________________________________  EKG  ED ECG REPORT I, Hinda Kehr, the attending physician, personally viewed and interpreted this ECG.  Date: 05/26/2018 EKG Time: 4:41 Rate: 81 Rhythm: a-fib QRS Axis: normal Intervals: normal ST/T Wave abnormalities: Non-specific ST segment / T-wave changes, but no evidence of acute ischemia. Narrative Interpretation: no evidence of acute ischemia   ____________________________________________  RADIOLOGY   ED MD interpretation:  No indication for imaging  Official radiology report(s): No results  found.  ____________________________________________   PROCEDURES  Critical Care performed: No   Procedure(s) performed:   Procedures   ____________________________________________   INITIAL IMPRESSION / ASSESSMENT AND PLAN / ED COURSE  As part of my medical decision making, I reviewed the following data within the [place] History obtained from family, Nursing notes reviewed and incorporated, Labs reviewed , EKG interpreted , Old chart reviewed and Notes from prior ED visits    Differential diagnosis includes, but is not limited to, acute infection causing transient hypoglycemia, most likely urinary tract infection; cardiogenic syncope or near syncope as result of arrhythmia; CVA; metabolic or electrolyte abnormality.  Patient is very well-appearing and in no acute distress with normal vital signs.  Fingerstick blood sugar upon arrival was 114.  She has no complaints or concerns at this time and stable vital signs.  However the fact that she has been having multiple episodes of hypoglycemia at night is concerning given that she is only taking metformin.  She is also trying to eat something extra before going to bed but she is still having these episodes in spite of them.  I am awaiting the rest of her lab work but since I started dictating this note the urinalysis came back and is indicative of a urinary tract infection which I will treat with ceftriaxone 1 g IV.  If the patient has reassuring lab work then the family is comfortable taking her home but they will not give her the second dose of metformin each day until she can follow-up with her primary care provider.  I think this is appropriate under the circumstances but labs are still pending.  Clinical Course as of May 26 732  [facility] May 26, 2018  0981 The patient's conference of metabolic panel came back and indicates acute renal failure with a BUN of 58 and creatinine of 2.53.  Her baseline creatinine seems to be  about 1.2.  I am giving 1 L of normal saline  as well as ceftriaxone 1 g IV as previously planned for her UTI.  I believe that her hypoglycemia is the result of her acute renal failure and the metformin accumulating in her system since it cannot likely her through her kidneys as efficiently.  I discussed it with the patient and her family and her need for admission.  I have also discussed the case in person with Dr. Marcille Blanco who will pass along her case to the daytime hospitalist.    [CF]    Clinical Course User Index [CF] Hinda Kehr, MD    ____________________________________________  FINAL CLINICAL IMPRESSION(S) / ED DIAGNOSES  Final diagnoses:  Urinary tract infection without hematuria, site unspecified  Fall, initial encounter  Hypoglycemia  Acute renal failure, unspecified acute renal failure type [facility])     MEDICATIONS GIVEN DURING THIS VISIT:  Medications  sodium chloride 0.9 % bolus 1,000 mL (1,000 mLs Intravenous New Bag/Given 05/26/18 0658)  cefTRIAXone (ROCEPHIN) 1 g in sodium chloride 0.9 % 100 mL IVPB (1 g Intravenous New Bag/Given 05/26/18 0034)     ED Discharge Orders    None       Note:  This document was prepared using Dragon voice recognition software and may include unintentional dictation errors.    Hinda Kehr, MD 05/26/18 [phone]

## 2018-05-26 NOTE — ED Notes (Signed)
Per EMS, pt has IV access and upon pushing D10, pt's vein infiltrated. Pt has large bruising upon arrival to ED in the right [facility] area.

## 2018-05-26 NOTE — H&P (Signed)
Pamela Hensley is an 83 y.o. female.   Chief Complaint: Fall HPI: The patient with past medical history of breast cancer, diabetes, coronary artery disease status post MI, atrial fibrillation and hypertension presents to the emergency department complaining of episodes of hypoglycemia.  The patient has had a few falls due to the same.  She denies palpitations, chest pain or shortness of breath.  He also denies fevers.  She has not hurt herself or struck her head.  Laboratory evaluation revealed acute kidney injury as well as urinary tract infection.  The patient was given ceftriaxone prior to the emergency department staff calling the hospitalist service for admission.  Past Medical History:  Diagnosis Date  . Anemia   . Anxiety   . Arthritis    osteoarthritis  . Atrial fibrillation ([place])   . Cancer (HCC)    Breast  . Coronary artery disease   . Diabetes mellitus without complication ([place])   . Hyperlipidemia   . Hypertension   . Myocardial infarction ([place]) 08/05  . Osteoporosis   . Personal history of radiation therapy     Past Surgical History:  Procedure Laterality Date  . ABDOMINAL HYSTERECTOMY    . APPENDECTOMY    . BLADDER SURGERY    . BREAST BIOPSY Right 10/22/2016   US guided biopsy (+)  . BREAST LUMPECTOMY Right 12/13/2016  . COLON SURGERY  10/99   hemi-colectomy  . HIP PINNING,CANNULATED Left 01/26/2017   Procedure: CANNULATED HIP PINNING;  Surgeon: Thornton Park, MD;  Location: [facility];  Service: Orthopedics;  Laterality: Left;  Marland Kitchen MASS EXCISION Right 04/15/2015   Procedure: MINOR EXCISION OF MASS INCLUSION RIGHT NECK CYST;  Surgeon: Beverly Gust, MD;  Location: [facility];  Service: ENT;  Laterality: Right;  ** local only **  . PARTIAL MASTECTOMY WITH NEEDLE LOCALIZATION Right 12/13/2016   Procedure: PARTIAL MASTECTOMY WITH NEEDLE LOCALIZATION;  Surgeon: Leonie Green, MD;  Location: [facility];  Service: General;  Laterality: Right;  . SENTINEL NODE  BIOPSY Right 12/13/2016   Procedure: SENTINEL NODE BIOPSY;  Surgeon: Leonie Green, MD;  Location: [facility];  Service: General;  Laterality: Right;    Family History  Problem Relation Age of Onset  . Breast cancer Neg Hx    Social History:  reports that she has never smoked. She has never used smokeless tobacco. She reports that she does not drink alcohol or use drugs.  Allergies: No Known Allergies  Prior to Admission medications   Medication Sig Start Date End Date Taking? Authorizing Provider  alendronate (FOSAMAX) 70 MG tablet Take 1 tablet (70 mg total) by mouth once a week. Take with a full glass of water on an empty stomach. 10/17/17  Yes Lloyd Huger, MD  aspirin 81 MG tablet Take 81 mg by mouth daily.   Yes [provider]  cholecalciferol (VITAMIN D3) 25 MCG (1000 UT) tablet Take 1,000 Units by mouth daily.   Yes [provider]  digoxin (LANOXIN) 0.125 MG tablet Take 0.125 mg by mouth daily.    Yes [provider]  diltiazem (CARDIZEM CD) 120 MG 24 hr capsule Take 120 mg by mouth daily.  07/23/17  Yes [provider]  glimepiride (AMARYL) 2 MG tablet Take 2 mg by mouth daily.  08/22/16  Yes [provider]  hydrochlorothiazide (HYDRODIURIL) 25 MG tablet Take 25 mg by mouth daily.    Yes [provider]  metFORMIN (GLUCOPHAGE) 500 MG tablet Take 250 mg by mouth  2 (two) times daily with a meal.    Yes [provider]  metoprolol succinate (TOPROL-XL) 50 MG 24 hr tablet Take 25 mg by mouth daily.  04/01/18  Yes [provider]  simvastatin (ZOCOR) 40 MG tablet Take 40 mg by mouth at bedtime.    Yes [provider]  sotalol (BETAPACE) 80 MG tablet Take 80 mg by mouth 2 (two) times daily.    Yes [provider]  tamoxifen (NOLVADEX) 20 MG tablet Take 1 tablet (20 mg total) by mouth daily. 10/17/17  Yes Lloyd Huger, MD  traMADol (ULTRAM) 50 MG tablet Take 50 mg by mouth 2 (two)  times daily as needed for pain. 05/19/18  Yes [provider]  venlafaxine XR (EFFEXOR-XR) 37.5 MG 24 hr capsule Take 37.5 mg by mouth daily with breakfast.   Yes [provider]  warfarin (COUMADIN) 3 MG tablet Take 4.5 mg by mouth at bedtime.    Yes [provider]     Results for orders placed or performed during the hospital encounter of 05/26/18 (from the past 48 hour(s))  Glucose, capillary     Status: Abnormal   Collection Time: 05/26/18  4:32 AM  Result Value Ref Range   Glucose-Capillary 114 (H) 70 - 99 mg/dL  CBC with Differential     Status: Abnormal   Collection Time: 05/26/18  4:59 AM  Result Value Ref Range   WBC 13.8 (H) 4.0 - 10.5 K/uL   RBC 3.94 3.87 - 5.11 MIL/uL   Hemoglobin 11.7 (L) 12.0 - 15.0 g/dL   HCT 36.6 36.0 - 46.0 %   MCV 92.9 80.0 - 100.0 fL   MCH 29.7 26.0 - 34.0 pg   MCHC 32.0 30.0 - 36.0 g/dL   RDW 13.5 11.5 - 15.5 %   Platelets 151 150 - 400 K/uL   nRBC 0.0 0.0 - 0.2 %   Neutrophils Relative % 83 %   Neutro Abs 11.5 (H) 1.7 - 7.7 K/uL   Lymphocytes Relative 9 %   Lymphs Abs 1.2 0.7 - 4.0 K/uL   Monocytes Relative 7 %   Monocytes Absolute 0.9 0.1 - 1.0 K/uL   Eosinophils Relative 0 %   Eosinophils Absolute 0.0 0.0 - 0.5 K/uL   Basophils Relative 0 %   Basophils Absolute 0.0 0.0 - 0.1 K/uL   Immature Granulocytes 1 %   Abs Immature Granulocytes 0.11 (H) 0.00 - 0.07 K/uL    Comment: Performed at [facility], [address] [place], [place] [address]  Basic metabolic panel     Status: Abnormal   Collection Time: 05/26/18  4:59 AM  Result Value Ref Range   Sodium 135 135 - 145 mmol/L   Potassium 4.9 3.5 - 5.1 mmol/L   Chloride 100 98 - 111 mmol/L   CO2 26 22 - 32 mmol/L   Glucose, Bld 91 70 - 99 mg/dL   BUN 58 (H) 8 - 23 mg/dL   Creatinine, Ser 2.53 (H) 0.44 - 1.00 mg/dL   Calcium 8.8 (L) 8.9 - 10.3 mg/dL   GFR calc non Af Amer 17 (L) >60 mL/min   GFR calc Af Amer 19 (L) >60 mL/min    Comment:  (NOTE) The eGFR has been calculated using the CKD EPI equation. This calculation has not been validated in all clinical situations. eGFR's persistently <60 mL/min signify possible Chronic Kidney Disease.    Anion gap 9 5 - 15    Comment: Performed at [facility]  [facility], [address] [place], [place] [address]  Troponin I - ONCE - STAT     Status: None   Collection Time: 05/26/18  4:59 AM  Result Value Ref Range   Troponin I <0.03 <0.03 ng/mL    Comment: Performed at [facility], [address] [place], [place] [address]  Urinalysis, Complete w Microscopic     Status: Abnormal   Collection Time: 05/26/18  4:59 AM  Result Value Ref Range   Color, Urine YELLOW (A) YELLOW   APPearance HAZY (A) CLEAR   Specific Gravity, Urine 1.008 1.005 - 1.030   pH 6.0 5.0 - 8.0   Glucose, UA NEGATIVE NEGATIVE mg/dL   Hgb urine dipstick NEGATIVE NEGATIVE   Bilirubin Urine NEGATIVE NEGATIVE   Ketones, ur NEGATIVE NEGATIVE mg/dL   Protein, ur 100 (A) NEGATIVE mg/dL   Nitrite NEGATIVE NEGATIVE   Leukocytes, UA SMALL (A) NEGATIVE   RBC / HPF 0-5 0 - 5 RBC/hpf   WBC, UA >50 (H) 0 - 5 WBC/hpf   Bacteria, UA RARE (A) NONE SEEN   Squamous Epithelial / LPF 0-5 0 - 5   Mucus PRESENT     Comment: Performed at [facility], [address] [place], [place] [address]  Protime-INR     Status: Abnormal   Collection Time: 05/26/18  5:25 AM  Result Value Ref Range   Prothrombin Time 38.4 (H) 11.4 - 15.2 seconds   INR 4.00     Comment: Performed at [facility], [address] [place], [place] [address]   No results found.  Review of Systems  Constitutional: Negative for chills and fever.  HENT: Negative for sore throat and tinnitus.   Eyes: Negative for blurred vision and redness.  Respiratory: Negative for cough and shortness of breath.   Cardiovascular: Negative for chest pain, palpitations, orthopnea and PND.  Gastrointestinal: Negative for abdominal pain,  diarrhea, nausea and vomiting.  Genitourinary: Negative for dysuria, frequency and urgency.  Musculoskeletal: Positive for falls. Negative for joint pain and myalgias.  Skin: Negative for rash.       No lesions  Neurological: Negative for speech change, focal weakness and weakness.  Endo/Heme/Allergies: Does not bruise/bleed easily.       No temperature intolerance  Psychiatric/Behavioral: Negative for depression and suicidal ideas.    Blood pressure 126/74, pulse 75, temperature 97.9 F (36.6 C), temperature source Oral, resp. rate 15, height _0  (1.499 m), weight 50.8 kg, SpO2 97 %. Physical Exam  Vitals reviewed. Constitutional: She is oriented to person, place, and time. She appears well-developed and well-nourished. No distress.  HENT:  Head: Normocephalic and atraumatic.  Mouth/Throat: Oropharynx is clear and moist.  Eyes: Pupils are equal, round, and reactive to light. Conjunctivae and EOM are normal. No scleral icterus.  Neck: Normal range of motion. Neck supple. No tracheal deviation present. No thyromegaly present.  Cardiovascular: Normal rate, regular rhythm and normal heart sounds. Exam reveals no gallop and no friction rub.  No murmur heard. Respiratory: Effort normal and breath sounds normal.  GI: Soft. Bowel sounds are normal. She exhibits no distension. There is no tenderness.  Genitourinary:  Genitourinary Comments: Deferred  Musculoskeletal: Normal range of motion. She exhibits no edema.  Lymphadenopathy:    She has no cervical adenopathy.  Neurological: She is alert and oriented to person, place, and time. No cranial nerve deficit. She exhibits normal muscle tone.  Skin: Skin is warm and dry. No rash noted. No erythema.  Psychiatric: She has a normal mood and affect. Her behavior is normal. Judgment and thought content normal.     Assessment/Plan This is an 83 year old female admitted for acute kidney injury. 1.  Acute kidney injury: Secondary to UTI and  dehydration.  Also likely contributes hypoglycemic episodes.  Hydrate with intravenous fluid.  Avoid nephrotoxic agents. 2.  UTI: Present on admission.  Continue ceftriaxone.  No signs or symptoms of sepsis 3.  Diabetes mellitus type II: Hold oral hypoglycemic agents.  Adding scale insulin with delicate sensitivity factor due to hypoglycemic episodes. 4.  Atrial fibrillation: Rate controlled; continue digoxin, diltiazem, metoprolol, sotalol and warfarin (supratherapeutic) 5.  History of breast cancer: Continue tamoxifen 6.  Depression: Continue Effexor 7.  DVT prophylaxis: As above 8.  GI prophylaxis: None The patient is a full code.  Time spent on admission orders and patient care approximately 45 minutes  Harrie Foreman, MD 05/26/2018, 7:44 AM

## 2018-05-27 LAB — GLUCOSE, CAPILLARY
Glucose-Capillary: 107 mg/dL — ABNORMAL HIGH (ref 70–99)
Glucose-Capillary: 179 mg/dL — ABNORMAL HIGH (ref 70–99)
Glucose-Capillary: 322 mg/dL — ABNORMAL HIGH (ref 70–99)
Glucose-Capillary: 147 mg/dL — ABNORMAL HIGH (ref 70–99)

## 2018-05-27 LAB — BASIC METABOLIC PANEL
ANION GAP: 7 (ref 5–15)
BUN: 52 mg/dL — ABNORMAL HIGH (ref 8–23)
CHLORIDE: 110 mmol/L (ref 98–111)
CO2: 22 mmol/L (ref 22–32)
Calcium: 8.4 mg/dL — ABNORMAL LOW (ref 8.9–10.3)
Creatinine, Ser: 2.05 mg/dL — ABNORMAL HIGH (ref 0.44–1.00)
GFR calc non Af Amer: 22 mL/min — ABNORMAL LOW (ref >60)
GFR, EST AFRICAN AMERICAN: 25 mL/min — AB (ref >60)
Glucose, Bld: 115 mg/dL — ABNORMAL HIGH (ref 70–99)
POTASSIUM: 5 mmol/L (ref 3.5–5.1)
Sodium: 139 mmol/L (ref 135–145)

## 2018-05-27 LAB — PROTIME-INR
INR: 2.1
Prothrombin Time: 23.3 seconds — ABNORMAL HIGH (ref 11.4–15.2)

## 2018-05-27 MED ORDER — WARFARIN SODIUM 6 MG PO TABS
6.0000 mg | ORAL_TABLET | Freq: Once | ORAL | Status: AC
  Administered 2018-05-27: 6 mg via ORAL
  Filled 2018-05-27: qty 1

## 2018-05-27 NOTE — Progress Notes (Signed)
ANTICOAGULATION CONSULT NOTE - Initial Consult  Pharmacy Consult for Warfarin Indication: atrial fibrillation  No Known Allergies  Patient Measurements: Height: 4\' 11"  (149.9 cm) Weight: 115 lb 11.9 oz (52.5 kg) IBW/kg (Calculated) : 43.2  Labs: Recent Labs    05/26/18 0459 05/26/18 0525 05/27/18 0536  HGB 11.7*  --   --   HCT 36.6  --   --   PLT 151  --   --   LABPROT  --  38.4* 23.3*  INR  --  4.00 2.10  CREATININE 2.53*  --  2.05*  TROPONINI <0.03  --   --     Estimated Creatinine Clearance: 15.9 mL/min (A) (by C-G formula based on SCr of 2.05 mg/dL (H)).   Medical History: Past Medical History:  Diagnosis Date  . Anemia   . Anxiety   . Arthritis    osteoarthritis  . Atrial fibrillation ([place])   . Cancer (HCC)    Breast  . Coronary artery disease   . Diabetes mellitus without complication ([place])   . Hyperlipidemia   . Hypertension   . Myocardial infarction ([place]) 08/05  . Osteoporosis   . Personal history of radiation therapy     Medications:  Scheduled:  . aspirin EC  81 mg Oral Daily  . atorvastatin  20 mg Oral q1800  . cholecalciferol  1,000 Units Oral Daily  . digoxin  0.125 mg Oral Daily  . diltiazem  120 mg Oral Daily  . docusate sodium  100 mg Oral BID  . hydrochlorothiazide  25 mg Oral Daily  . insulin aspart  0-5 Units Subcutaneous QHS  . insulin aspart  0-9 Units Subcutaneous TID WC  . metoprolol succinate  25 mg Oral Daily  . sotalol  80 mg Oral BID  . tamoxifen  20 mg Oral Daily  . venlafaxine XR  37.5 mg Oral Q breakfast  . Warfarin - Pharmacist Dosing Inpatient   Does not apply q1800    Assessment: 83 yo F on Warfarin PTA for Atrial Fibrillation.  Home dose= 4.5 mg daily (per Med Rec). Hgb 11.7  Plt 151. DDIs include aspirin and tamoxifen. However, she was on both PTA.   Date INR Dose 11/18   4.00   Hold  11/19 2.10  6 mg     Goal of Therapy:  INR 2-3 Monitor platelets by anticoagulation protocol: Yes   Plan:  There was a  fairly large INR drop from yesterday without any vitamin K administered. Will give slightly less than a 50% increase in dose because of this to prevent further decline. Pharmacy will re-evaluate the INR in the morning and adjust dose accordingly.  Dallie Piles, PharmD 05/27/2018,7:10 AM

## 2018-05-27 NOTE — Progress Notes (Signed)
Pamela Hensley NAME: Pamela Hensley    MR#:  [id number]  DATE OF BIRTH:  1935/05/31  SUBJECTIVE:  CHIEF COMPLAINT: Patient is resting comfortably.  Denies any abdominal pain or back pain.  Denies any nausea vomiting  REVIEW OF SYSTEMS:  CONSTITUTIONAL: No fever, fatigue or weakness.  EYES: No blurred or double vision.  EARS, NOSE, AND THROAT: No tinnitus or ear pain.  RESPIRATORY: No cough, shortness of breath, wheezing or hemoptysis.  CARDIOVASCULAR: No chest pain, orthopnea, edema.  GASTROINTESTINAL: No nausea, vomiting, diarrhea or abdominal pain.  GENITOURINARY: No dysuria, hematuria.  ENDOCRINE: No polyuria, nocturia,  HEMATOLOGY: No anemia, easy bruising or bleeding SKIN: No rash or lesion. MUSCULOSKELETAL: No joint pain or arthritis.   NEUROLOGIC: No tingling, numbness, weakness.  PSYCHIATRY: No anxiety or depression.   DRUG ALLERGIES:  No Known Allergies  VITALS:  Blood pressure 128/61, pulse 69, temperature 98.2 F (36.8 C), temperature source Oral, resp. rate 20, height 4\' 11"  (1.499 m), weight 52.5 kg, SpO2 98 %.  PHYSICAL EXAMINATION:  GENERAL:  83 y.o.-year-old patient lying in the bed with no acute distress.  EYES: Pupils equal, round, reactive to light and accommodation. No scleral icterus. Extraocular muscles intact.  HEENT: Head atraumatic, normocephalic. Oropharynx and nasopharynx clear.  NECK:  Supple, no jugular venous distention. No thyroid enlargement, no tenderness.  LUNGS: Normal breath sounds bilaterally, no wheezing, rales,rhonchi or crepitation. No use of accessory muscles of respiration.  CARDIOVASCULAR: S1, S2 normal. No murmurs, rubs, or gallops.  ABDOMEN: Soft, nontender, nondistended. Bowel sounds present. No organomegaly or mass.  EXTREMITIES: No pedal edema, cyanosis, or clubbing.  NEUROLOGIC: Cranial nerves II through XII are intact. Muscle strength 5/5 in all extremities. Sensation  intact. Gait not checked.  PSYCHIATRIC: The patient is alert and oriented x 3.  SKIN: No obvious rash, lesion, or ulcer.    LABORATORY PANEL:   CBC Recent Labs  Lab 05/26/18 0459  WBC 13.8*  HGB 11.7*  HCT 36.6  PLT 151   ------------------------------------------------------------------------------------------------------------------  Chemistries  Recent Labs  Lab 05/27/18 0536  NA 139  K 5.0  CL 110  CO2 22  GLUCOSE 115*  BUN 52*  CREATININE 2.05*  CALCIUM 8.4*   ------------------------------------------------------------------------------------------------------------------  Cardiac Enzymes Recent Labs  Lab 05/26/18 0459  TROPONINI <0.03   ------------------------------------------------------------------------------------------------------------------  RADIOLOGY:  No results found.  EKG:   Orders placed or performed during the hospital encounter of 05/26/18  . ED EKG  . ED EKG  . EKG 12-Lead  . EKG 12-Lead  . EKG 12-Lead  . EKG 12-Lead    ASSESSMENT AND PLAN:     This is an 83 year old female admitted for acute kidney injury. 1.  Acute kidney injury: Secondary to UTI and dehydration.  Also li  Hydrate with intravenous fluid.    Repeat BMP in a.m. Monitor renal function baseline creatinine 1.1-1.2 During this admission creatinine 2.53-2.05  avoid nephrotoxic agents. 2.  UTI: Present on admission.  Continue ceftriaxone.  No signs or symptoms of sepsis follow-up urine culture and sensitivity 3.  Diabetes mellitus type II: Hold oral hypoglycemic agents.    Sliding scale insulin with delicate sensitivity factor due to hypoglycemic episodes. 4.  Atrial fibrillation: Rate controlled; continue digoxin, diltiazem, metoprolol, sotalol and warfarin (supratherapeutic) 5.  History of breast cancer: Continue tamoxifen 6.  Depression: Continue Effexor 7.  DVT prophylaxis: On Coumadin INR is therapeutic at 2.1   All the records are reviewed and  case  discussed with Care Management/Social Workerr. Management plans discussed with the patient, family and they are in agreement.  CODE STATUS: FC  TOTAL TIME TAKING CARE OF THIS PATIENT: 35  minutes.   POSSIBLE D/C IN 1-2  DAYS, DEPENDING ON CLINICAL CONDITION.  Note: This dictation was prepared with Dragon dictation along with smaller phrase technology. Any transcriptional errors that result from this process are unintentional.   Nicholes Mango M.D on 05/27/2018 at 3:45 PM  Between 7am to 6pm - Pager - [phone] After 6pm go to [email or web address] - password [email or web address] [facility]  Office  [phone]  CC: Primary care physician; Rusty Aus, MD

## 2018-05-28 ENCOUNTER — Inpatient Hospital Stay: Payer: Medicare Other

## 2018-05-28 LAB — CBC
HCT: 30 % — ABNORMAL LOW (ref 36.0–46.0)
Hemoglobin: 9.3 g/dL — ABNORMAL LOW (ref 12.0–15.0)
MCH: 29.5 pg (ref 26.0–34.0)
MCHC: 31 g/dL (ref 30.0–36.0)
MCV: 95.2 fL (ref 80.0–100.0)
PLATELETS: 130 10*3/uL — AB (ref 150–400)
RBC: 3.15 MIL/uL — AB (ref 3.87–5.11)
RDW: 13.6 % (ref 11.5–15.5)
WBC: 11.7 10*3/uL — ABNORMAL HIGH (ref 4.0–10.5)
nRBC: 0 % (ref 0.0–0.2)

## 2018-05-28 LAB — BASIC METABOLIC PANEL
Anion gap: 5 (ref 5–15)
BUN: 55 mg/dL — AB (ref 8–23)
CALCIUM: 8.3 mg/dL — AB (ref 8.9–10.3)
CO2: 23 mmol/L (ref 22–32)
Chloride: 111 mmol/L (ref 98–111)
Creatinine, Ser: 2.34 mg/dL — ABNORMAL HIGH (ref 0.44–1.00)
GFR calc Af Amer: 21 mL/min — ABNORMAL LOW (ref >60)
GFR, EST NON AFRICAN AMERICAN: 18 mL/min — AB (ref >60)
Glucose, Bld: 158 mg/dL — ABNORMAL HIGH (ref 70–99)
Potassium: 4.9 mmol/L (ref 3.5–5.1)
Sodium: 139 mmol/L (ref 135–145)

## 2018-05-28 LAB — PROTIME-INR
INR: 1.5
Prothrombin Time: 17.9 seconds — ABNORMAL HIGH (ref 11.4–15.2)

## 2018-05-28 LAB — GLUCOSE, CAPILLARY
Glucose-Capillary: 166 mg/dL — ABNORMAL HIGH (ref 70–99)
Glucose-Capillary: 257 mg/dL — ABNORMAL HIGH (ref 70–99)

## 2018-05-28 MED ORDER — WARFARIN SODIUM 3 MG PO TABS: 4.5000 mg | ORAL_TABLET | Freq: Every day | ORAL | Status: AC

## 2018-05-28 MED ORDER — WARFARIN SODIUM 7.5 MG PO TABS
7.5000 mg | ORAL_TABLET | Freq: Once | ORAL | Status: DC
  Filled 2018-05-28: qty 1

## 2018-05-28 MED ORDER — CEFDINIR 300 MG PO CAPS: 300.0000 mg | ORAL_CAPSULE | Freq: Two times a day (BID) | ORAL | Status: DC

## 2018-05-28 MED ORDER — CEFDINIR 300 MG PO CAPS: 300.0000 mg | ORAL_CAPSULE | Freq: Two times a day (BID) | ORAL | 0 refills | Status: AC

## 2018-05-28 NOTE — Care Management Note (Signed)
Case Management Note  Patient Details  Name: Pamela Hensley MRN: [id number] Date of Birth: 1934/11/01  Subjective/Objective:   Admitted to [facility] with the diagnosis of acute renal injury. Lives with husband. Daughter is [place] [phone]). Prescriptions are filled at [facility] in [place].  No home Health. No skilled facility. No home oxygen. Rolling walker and bedside commode in the home, if needed. Takes care of all basic activities of living herself, drives. States last fall was 2 years ago and she broke her hip at that time. Good appetite. Husband will transport.               Action/Plan: No discharge needs identified at this time.   Expected Discharge Date:                  Expected Discharge Plan:     In-House Referral:   yes  Discharge planning Services   yes  Post Acute Care Choice:    Choice offered to:     DME Arranged:    DME Agency:     HH Arranged:    HH Agency:     Status of Service:     If discussed at [employer] of Stay Meetings, dates discussed:    Additional Comments:  Shelbie Ammons, RN MSN CCM Care Management [phone] 05/28/2018, 12:17 PM

## 2018-05-28 NOTE — Consult Note (Signed)
[facility]  CONSULT NOTE    Date: 05/28/2018                  Patient Name:  Pamela Hensley  MRN: [id number]  DOB: Nov 02, 1934  Age / Sex: 83 y.o., female         PCP: Rusty Aus, MD                 Service Requesting Consult: Dr. Margaretmary Eddy                 Reason for Consult: Acute renal failure on chronic kidney disease stage III            History of Present Illness: Ms. Pamela Hensley is a 83 y.o. white female with hypertension, diabetes mellitus type II, coronary artery disease, hyperlipidemia, atrial fibrillation, hemicolectomy, history of breast cancer,  who was admitted to [facility] on 05/26/2018 for Hypoglycemia [E16.2] Fall, initial encounter [W19.XXXA] Urinary tract infection without hematuria, site unspecified [N39.0] Acute renal failure, unspecified acute renal failure type ([place]) [N17.9]   Patient found to have urine tract infection with klebsiellla.   Nephrology consulted for acute renal failure. Husband at bedside. Assists with history taking.   Baseline creatinine of 1.9, GFR of 25 on 02/25/18. Seems to have GFR ranging from 25-40 in the last 12 months.    Medications: Outpatient medications: Medications Prior to Admission  Medication Sig Dispense Refill Last Dose  . alendronate (FOSAMAX) 70 MG tablet Take 1 tablet (70 mg total) by mouth once a week. Take with a full glass of water on an empty stomach. 12 tablet 3 Past Week at Unknown time  . aspirin 81 MG tablet Take 81 mg by mouth daily.   05/25/2018 at Unknown time  . cholecalciferol (VITAMIN D3) 25 MCG (1000 UT) tablet Take 1,000 Units by mouth daily.   05/25/2018 at Unknown time  . digoxin (LANOXIN) 0.125 MG tablet Take 0.125 mg by mouth daily.    05/25/2018 at Unknown time  . diltiazem (CARDIZEM CD) 120 MG 24 hr capsule Take 120 mg by mouth daily.    05/25/2018 at Unknown time  . glimepiride (AMARYL) 2 MG tablet Take 2 mg by mouth daily.    05/25/2018 at Unknown time  . hydrochlorothiazide  (HYDRODIURIL) 25 MG tablet Take 25 mg by mouth daily.    05/25/2018 at Unknown time  . metFORMIN (GLUCOPHAGE) 500 MG tablet Take 250 mg by mouth 2 (two) times daily with a meal.    05/25/2018 at Unknown time  . metoprolol succinate (TOPROL-XL) 50 MG 24 hr tablet Take 25 mg by mouth daily.    05/25/2018 at Unknown time  . simvastatin (ZOCOR) 40 MG tablet Take 40 mg by mouth at bedtime.    05/25/2018 at Unknown time  . sotalol (BETAPACE) 80 MG tablet Take 80 mg by mouth 2 (two) times daily.    05/25/2018 at Unknown time  . tamoxifen (NOLVADEX) 20 MG tablet Take 1 tablet (20 mg total) by mouth daily. 90 tablet 3 05/25/2018 at Unknown time  . traMADol (ULTRAM) 50 MG tablet Take 50 mg by mouth 2 (two) times daily as needed for pain.  1 prn at prn  . venlafaxine XR (EFFEXOR-XR) 37.5 MG 24 hr capsule Take 37.5 mg by mouth daily with breakfast.   05/25/2018 at Unknown time  . [DISCONTINUED] warfarin (COUMADIN) 3 MG tablet Take 4.5 mg by mouth at bedtime.    05/25/2018 at Unknown time  Current medications: Current Facility-Administered Medications  Medication Dose Route Frequency Provider Last Rate Last Dose  . 0.9 %  sodium chloride infusion   Intravenous Continuous Harrie Foreman, MD   Stopped at 05/28/18 1504  . acetaminophen (TYLENOL) tablet 650 mg  650 mg Oral Q6H PRN Harrie Foreman, MD       Or  . acetaminophen (TYLENOL) suppository 650 mg  650 mg Rectal Q6H PRN Harrie Foreman, MD      . aspirin EC tablet 81 mg  81 mg Oral Daily Harrie Foreman, MD   81 mg at 05/28/18 1011  . atorvastatin (LIPITOR) tablet 20 mg  20 mg Oral q1800 Dallie Piles, RPH   20 mg at 05/27/18 1659  . [START ON 05/29/2018] cefdinir (OMNICEF) capsule 300 mg  300 mg Oral Q12H Gouru, Aruna, MD      . cholecalciferol (VITAMIN D3) tablet 1,000 Units  1,000 Units Oral Daily Harrie Foreman, MD   1,000 Units at 05/28/18 1011  . digoxin (LANOXIN) tablet 0.125 mg  0.125 mg Oral Daily Harrie Foreman, MD    0.125 mg at 05/28/18 1011  . diltiazem (CARDIZEM CD) 24 hr capsule 120 mg  120 mg Oral Daily Harrie Foreman, MD   120 mg at 05/28/18 1012  . docusate sodium (COLACE) capsule 100 mg  100 mg Oral BID Harrie Foreman, MD   100 mg at 05/28/18 1012  . hydrochlorothiazide (HYDRODIURIL) tablet 25 mg  25 mg Oral Daily Harrie Foreman, MD   25 mg at 05/28/18 1011  . insulin aspart (novoLOG) injection 0-5 Units  0-5 Units Subcutaneous QHS Harrie Foreman, MD      . insulin aspart (novoLOG) injection 0-9 Units  0-9 Units Subcutaneous TID WC Harrie Foreman, MD   5 Units at 05/28/18 1243  . metoprolol succinate (TOPROL-XL) 24 hr tablet 25 mg  25 mg Oral Daily Harrie Foreman, MD   25 mg at 05/28/18 1011  . ondansetron (ZOFRAN) tablet 4 mg  4 mg Oral Q6H PRN Harrie Foreman, MD       Or  . ondansetron [facility]) injection 4 mg  4 mg Intravenous Q6H PRN Harrie Foreman, MD      . sotalol (BETAPACE) tablet 80 mg  80 mg Oral BID Harrie Foreman, MD   80 mg at 05/28/18 1012  . tamoxifen (NOLVADEX) tablet 20 mg  20 mg Oral Daily Harrie Foreman, MD   20 mg at 05/28/18 1012  . venlafaxine XR (EFFEXOR-XR) 24 hr capsule 37.5 mg  37.5 mg Oral Q breakfast Harrie Foreman, MD   37.5 mg at 05/28/18 0837  . warfarin (COUMADIN) tablet 7.5 mg  7.5 mg Oral Once Dallie Piles, [facility]      . Warfarin - Pharmacist Dosing Inpatient   Does not apply q1800 Harrie Foreman, MD          Allergies: No Known Allergies    Past Medical History: Past Medical History:  Diagnosis Date  . Anemia   . Anxiety   . Arthritis    osteoarthritis  . Atrial fibrillation ([place])   . Cancer (HCC)    Breast  . Coronary artery disease   . Diabetes mellitus without complication ([place])   . Hyperlipidemia   . Hypertension   . Myocardial infarction ([place]) 08/05  . Osteoporosis   . Personal history of radiation therapy      Past Surgical History: Past  Surgical History:  Procedure Laterality Date  .  ABDOMINAL HYSTERECTOMY    . APPENDECTOMY    . BLADDER SURGERY    . BREAST BIOPSY Right 10/22/2016   US guided biopsy (+)  . BREAST LUMPECTOMY Right 12/13/2016  . COLON SURGERY  10/99   hemi-colectomy  . HIP PINNING,CANNULATED Left 01/26/2017   Procedure: CANNULATED HIP PINNING;  Surgeon: Thornton Park, MD;  Location: [facility];  Service: Orthopedics;  Laterality: Left;  Marland Kitchen MASS EXCISION Right 04/15/2015   Procedure: MINOR EXCISION OF MASS INCLUSION RIGHT NECK CYST;  Surgeon: Beverly Gust, MD;  Location: [place];  Service: ENT;  Laterality: Right;  ** local only **  . PARTIAL MASTECTOMY WITH NEEDLE LOCALIZATION Right 12/13/2016   Procedure: PARTIAL MASTECTOMY WITH NEEDLE LOCALIZATION;  Surgeon: Leonie Green, MD;  Location: [facility];  Service: General;  Laterality: Right;  . SENTINEL NODE BIOPSY Right 12/13/2016   Procedure: SENTINEL NODE BIOPSY;  Surgeon: Leonie Green, MD;  Location: [facility];  Service: General;  Laterality: Right;     Family History: Family History  Problem Relation Age of Onset  . Breast cancer Neg Hx      Social History: Social History   Socioeconomic History  . Marital status: Married    Spouse name: Not on file  . Number of children: Not on file  . Years of education: Not on file  . Highest education level: Not on file  Occupational History  . Not on file  Social Needs  . Financial resource strain: Not on file  . Food insecurity:    Worry: Not on file    Inability: Not on file  . Transportation needs:    Medical: Not on file    Non-medical: Not on file  Tobacco Use  . Smoking status: Never Smoker  . Smokeless tobacco: Never Used  Substance and Sexual Activity  . Alcohol use: No  . Drug use: No  . Sexual activity: Not Currently  Lifestyle  . Physical activity:    Days per week: Not on file    Minutes per session: Not on file  . Stress: Not on file  Relationships  . Social connections:    Talks on phone: Not on  file    Gets together: Not on file    Attends religious service: Not on file    Active member of club or organization: Not on file    Attends meetings of clubs or organizations: Not on file    Relationship status: Not on file  . Intimate partner violence:    Fear of current or ex partner: Not on file    Emotionally abused: Not on file    Physically abused: Not on file    Forced sexual activity: Not on file  Other Topics Concern  . Not on file  Social History Narrative  . Not on file     Review of Systems: Review of Systems  Constitutional: Negative.   HENT: Negative.   Eyes: Negative.   Respiratory: Negative.   Cardiovascular: Negative.   Gastrointestinal: Negative.   Genitourinary: Negative.   Musculoskeletal: Negative.   Skin: Negative.   Neurological: Negative.   Endo/Heme/Allergies: Negative.   Psychiatric/Behavioral: Negative.     Vital Signs: Blood pressure (!) 94/51, pulse 67, temperature 98.4 F (36.9 C), temperature source Oral, resp. rate 20, height 4\' 11"  (1.499 m), weight 52.9 kg, SpO2 100 %.  Weight trends: Filed Weights   05/26/18 0433 05/27/18 0701 05/28/18 0600  Weight:  50.8 kg 52.5 kg 52.9 kg    Physical Exam: General: NAD,   Head: Normocephalic, atraumatic. Moist oral mucosal membranes  Eyes: Anicteric, PERRL  Neck: Supple, trachea midline  Lungs:  Clear to auscultation  Heart: Regular rate and rhythm  Abdomen:  Soft, nontender,   Extremities: no peripheral edema.  Neurologic: Nonfocal, moving all four extremities  Skin: No lesions         Lab results: Basic Metabolic Panel: Recent Labs  Lab 05/26/18 0459 05/27/18 0536 05/28/18 0514  NA 135 139 139  K 4.9 5.0 4.9  CL 100 110 111  CO2 26 22 23   GLUCOSE 91 115* 158*  BUN 58* 52* 55*  CREATININE 2.53* 2.05* 2.34*  CALCIUM 8.8* 8.4* 8.3*    Liver Function Tests: No results for input(s): AST, ALT, ALKPHOS, BILITOT, PROT, ALBUMIN in the last 168 hours. No results for input(s):  LIPASE, AMYLASE in the last 168 hours. No results for input(s): AMMONIA in the last 168 hours.  CBC: Recent Labs  Lab 05/26/18 0459 05/28/18 0514  WBC 13.8* 11.7*  NEUTROABS 11.5*  --   HGB 11.7* 9.3*  HCT 36.6 30.0*  MCV 92.9 95.2  PLT 151 130*    Cardiac Enzymes: Recent Labs  Lab 05/26/18 0459  TROPONINI <0.03    BNP: Invalid input(s): POCBNP  CBG: Recent Labs  Lab 05/27/18 1136 05/27/18 1640 05/27/18 2050 05/28/18 0822 05/28/18 1230  GLUCAP 179* 322* 147* 166* 43*    Microbiology: Results for orders placed or performed during the hospital encounter of 05/26/18  Urine Culture     Status: Abnormal (Preliminary result)   Collection Time: 05/26/18  4:59 AM  Result Value Ref Range Status   Specimen Description   Final    URINE, CLEAN CATCH Performed at [facility], [address] [place], [place] [address]    Special Requests   Final    Normal Performed at [facility], [address] [place], [place] [address]    Culture >=100,000 COLONIES/mL KLEBSIELLA PNEUMONIAE (A)  Final   Report Status PENDING  Incomplete    Coagulation Studies: Recent Labs    05/26/18 0525 05/27/18 0536 05/28/18 0514  LABPROT 38.4* 23.3* 17.9*  INR 4.00 2.10 1.50    Urinalysis: Recent Labs    05/26/18 0459  COLORURINE YELLOW*  LABSPEC 1.008  PHURINE 6.0  GLUCOSEU NEGATIVE  HGBUR NEGATIVE  BILIRUBINUR NEGATIVE  KETONESUR NEGATIVE  PROTEINUR 100*  NITRITE NEGATIVE  LEUKOCYTESUR SMALL*      Imaging: US Renal  Result Date: 05/28/2018 CLINICAL DATA:  Acute kidney injury. EXAM: RENAL / URINARY TRACT ULTRASOUND COMPLETE COMPARISON:  Renal ultrasound dated Nov 22, 2016. FINDINGS: Right Kidney: Renal measurements: 10.1 x 4.8 x 5.4 cm = volume: 135 mL. Increased echogenicity. No mass or hydronephrosis visualized. Mild fullness of the renal pelvis. Unchanged 1 cm simple cyst. Left Kidney: Renal measurements: 9.5 x 4.3 x 4.8 cm = volume: 102 mL.  Increased echogenicity. No mass or hydronephrosis visualized. Two simple cysts measuring up to 1.3 cm are unchanged. Bladder: Appears normal for degree of bladder distention. IMPRESSION: 1. No acute abnormality. 2. Persistent increased renal cortical echogenicity, consistent with medical renal disease. 3. Unchanged bilateral renal cysts. Electronically Signed   By: Titus Dubin M.D.   On: 05/28/2018 11:55      Assessment & Plan: Ms. CARYL FATE is a 83 y.o. white female with hypertension, diabetes mellitus type II, coronary artery disease, hyperlipidemia, atrial fibrillation, hemicolectomy, history of  breast cancer,  who was admitted to [facility] on 05/26/2018 for Hypoglycemia [E16.2] Fall, initial encounter [W19.XXXA] Urinary tract infection without hematuria, site unspecified [N39.0] Acute renal failure, unspecified acute renal failure type ([place]) [N17.9]   1. Acute renal failure on chronic kidney disease stage III: baseline creatinine of 1.9, GFR of 25 on 02/25/18.  Treated with IV fluids - Agree with discontinuation of hydrochlorothiazide.   2. Urinary tract infection: Klebsiella - omnicef  3. Hypertension: blood pressure at goal. Discontinued hydrochlorothiazide as above.  - home regimen to continue sotalol, metoprolol, diltiazem  4. Diabetes mellitus type II with chronic kidney disease: hypoglycemic overnight - discontinued metformin.   Will require follow up with Nephrology: 12/11 at 2:20pm  LOS: 2 Frandy Basnett 11/20/20194:23 PM

## 2018-05-28 NOTE — Progress Notes (Signed)
Patient discharged per MD order. Prescription given to patient. All discharge instructions given and all questions answered.

## 2018-05-28 NOTE — Progress Notes (Signed)
ANTICOAGULATION CONSULT NOTE - Initial Consult  Pharmacy Consult for Warfarin Indication: atrial fibrillation  No Known Allergies  Patient Measurements: Height: 4\' 11"  (149.9 cm) Weight: 116 lb 10 oz (52.9 kg) IBW/kg (Calculated) : 43.2  Labs: Recent Labs    05/26/18 0459 05/26/18 0525 05/27/18 0536 05/28/18 0514  HGB 11.7*  --   --  9.3*  HCT 36.6  --   --  30.0*  PLT 151  --   --  130*  LABPROT  --  38.4* 23.3* 17.9*  INR  --  4.00 2.10 1.50  CREATININE 2.53*  --  2.05* 2.34*  TROPONINI <0.03  --   --   --     Estimated Creatinine Clearance: 14 mL/min (A) (by C-G formula based on SCr of 2.34 mg/dL (H)).   Medical History: Past Medical History:  Diagnosis Date  . Anemia   . Anxiety   . Arthritis    osteoarthritis  . Atrial fibrillation ([place])   . Cancer (HCC)    Breast  . Coronary artery disease   . Diabetes mellitus without complication ([place])   . Hyperlipidemia   . Hypertension   . Myocardial infarction ([place]) 08/05  . Osteoporosis   . Personal history of radiation therapy     Medications:  Scheduled:  . aspirin EC  81 mg Oral Daily  . atorvastatin  20 mg Oral q1800  . cholecalciferol  1,000 Units Oral Daily  . digoxin  0.125 mg Oral Daily  . diltiazem  120 mg Oral Daily  . docusate sodium  100 mg Oral BID  . hydrochlorothiazide  25 mg Oral Daily  . insulin aspart  0-5 Units Subcutaneous QHS  . insulin aspart  0-9 Units Subcutaneous TID WC  . metoprolol succinate  25 mg Oral Daily  . sotalol  80 mg Oral BID  . tamoxifen  20 mg Oral Daily  . venlafaxine XR  37.5 mg Oral Q breakfast  . Warfarin - Pharmacist Dosing Inpatient   Does not apply q1800    Assessment: 83 yo F on Warfarin PTA for Atrial Fibrillation.  Home dose= 4.5 mg daily (per Med Rec). Hgb 11.7  Plt 151. DDIs include aspirin and tamoxifen. However, she was on both PTA. There are no new DDIs since last evaluation.  Date INR Dose 11/18   4.00   Hold  11/19 2.10 6 mg  11/21 1.50 7.5  mg   Goal of Therapy:  INR 2-3 Monitor platelets by anticoagulation protocol: Yes   Plan:  There was another fairly large INR drop from yesterday without any vitamin K administered. Will give slightly more than a 50% increase in dose (7.5mg ) because of this to prevent further decline. Pharmacy will re-evaluate the INR in the morning and adjust dose accordingly.  Dallie Piles, PharmD 05/28/2018,8:37 AM

## 2018-05-28 NOTE — Discharge Summary (Addendum)
[facility] NAME: Pamela Hensley    MR#:  [id number]  DATE OF BIRTH:  May 06, 1935  DATE OF ADMISSION:  05/26/2018 ADMITTING PHYSICIAN: Harrie Foreman, MD  DATE OF DISCHARGE:  05/28/18 PRIMARY CARE PHYSICIAN: Rusty Aus, MD    ADMISSION DIAGNOSIS:  Hypoglycemia [E16.2] Fall, initial encounter [W19.XXXA] Urinary tract infection without hematuria, site unspecified [N39.0] Acute renal failure, unspecified acute renal failure type ([place]) [N17.9]  DISCHARGE DIAGNOSIS:  AKI  UTI with Klebsiella sensitivities are still pending  SECONDARY DIAGNOSIS:   Past Medical History:  Diagnosis Date  . Anemia   . Anxiety   . Arthritis    osteoarthritis  . Atrial fibrillation ([place])   . Cancer (HCC)    Breast  . Coronary artery disease   . Diabetes mellitus without complication ([place])   . Hyperlipidemia   . Hypertension   . Myocardial infarction ([place]) 08/05  . Osteoporosis   . Personal history of radiation therapy     HOSPITAL COURSE:   This is an 83 year old female admitted for acute kidney injury. 1. Acute kidney injury on chronic kidney disease stage III secondary to UTI and dehydration. Also li Hydrate with intravenous fluid. Monitor renal function baseline creatinine 1.1-1.2 During this admission creatinine 2.53-2.05-2.34 today avoid nephrotoxic agents.  Discontinued metformin and hydrochlorothiazide Renal ultrasound with no acute findings Nephrology consult placed and patient seen by Dr. Juleen China outpatient follow-up with Dr. Juleen China is recommended, pt is agreeable 2. UTI: Present on admission. Medically patient is doing much better.  Urine culture has revealed Klebsiella but final sensitivities are pending.  PCP to consider following on the final sensitivity results. Patient responded to Rocephin will discharge patient with [facility] for 4 more days starting from tomorrow 3. Diabetes mellitus type II:  On oral  hypoglycemic agent Amaryl will be continued hold metformin ; pt given Sliding scale insulin with delicate sensitivity factor due to hypoglycemic episodes during the hospital course 4. Atrial fibrillation: Rate controlled; continue digoxin, diltiazem, metoprolol, sotalol and warfarin INR at 1.5.  Follow-up with primary care physician for repeat INR as recommended 5. History of breast cancer: Continue tamoxifen 6. Depression: Continue Effexor 7. DVT prophylaxis: On Coumadin   DISCHARGE CONDITIONS:   Stable  CONSULTS OBTAINED:     PROCEDURES none  DRUG ALLERGIES:  No Known Allergies  DISCHARGE MEDICATIONS:   Allergies as of 05/28/2018   No Known Allergies     Medication List    STOP taking these medications   hydrochlorothiazide 25 MG tablet Commonly known as:  HYDRODIURIL   metFORMIN 500 MG tablet Commonly known as:  GLUCOPHAGE     TAKE these medications   alendronate 70 MG tablet Commonly known as:  FOSAMAX Take 1 tablet (70 mg total) by mouth once a week. Take with a full glass of water on an empty stomach.   aspirin 81 MG tablet Take 81 mg by mouth daily.   cefdinir 300 MG capsule Commonly known as:  OMNICEF Take 1 capsule (300 mg total) by mouth every 12 (twelve) hours for 4 days. Start taking on:  05/29/2018   cholecalciferol 25 MCG (1000 UT) tablet Commonly known as:  VITAMIN D3 Take 1,000 Units by mouth daily.   digoxin 0.125 MG tablet Commonly known as:  LANOXIN Take 0.125 mg by mouth daily.   diltiazem 120 MG 24 hr capsule Commonly known as:  CARDIZEM CD Take 120 mg by mouth daily.   glimepiride 2 MG  tablet Commonly known as:  AMARYL Take 2 mg by mouth daily.   metoprolol succinate 50 MG 24 hr tablet Commonly known as:  TOPROL-XL Take 25 mg by mouth daily.   simvastatin 40 MG tablet Commonly known as:  ZOCOR Take 40 mg by mouth at bedtime.   sotalol 80 MG tablet Commonly known as:  BETAPACE Take 80 mg by mouth 2 (two) times daily.    tamoxifen 20 MG tablet Commonly known as:  NOLVADEX Take 1 tablet (20 mg total) by mouth daily.   traMADol 50 MG tablet Commonly known as:  ULTRAM Take 50 mg by mouth 2 (two) times daily as needed for pain.   venlafaxine XR 37.5 MG 24 hr capsule Commonly known as:  EFFEXOR-XR Take 37.5 mg by mouth daily with breakfast.   warfarin 3 MG tablet Commonly known as:  COUMADIN Take 1.5 tablets (4.5 mg total) by mouth at bedtime. Start taking on:  05/29/2018        DISCHARGE INSTRUCTIONS:   Follow-up with primary care physician in 3 to 4 days Follow-up with nephrology Dr. Juleen China in a week  DIET:  Cardiac diet and Diabetic diet  DISCHARGE CONDITION:  Fair  ACTIVITY:  Activity as tolerated  OXYGEN:  Home Oxygen: No.   Oxygen Delivery: room air  DISCHARGE LOCATION:  home   If you experience worsening of your admission symptoms, develop shortness of breath, life threatening emergency, suicidal or homicidal thoughts you must seek medical attention immediately by calling 911 or calling your MD immediately  if symptoms less severe.  You Must read complete instructions/literature along with all the possible adverse reactions/side effects for all the Medicines you take and that have been prescribed to you. Take any new Medicines after you have completely understood and accpet all the possible adverse reactions/side effects.   Please note  You were cared for by a hospitalist during your hospital stay. If you have any questions about your discharge medications or the care you received while you were in the hospital after you are discharged, you can call the unit and asked to speak with the hospitalist on call if the hospitalist that took care of you is not available. Once you are discharged, your primary care physician will handle any further medical issues. Please note that NO REFILLS for any discharge medications will be authorized once you are discharged, as it is imperative that  you return to your primary care physician (or establish a relationship with a primary care physician if you do not have one) for your aftercare needs so that they can reassess your need for medications and monitor your lab values.     Today  Chief Complaint  Patient presents with  . Hypoglycemia  . Fall   Patient denies any complaints.  Denies any dizzy spells or chest pain or shortness of breath.  Denies any abdominal pain and really wants to go home.  Refusing to stay 1 more day She prefers seeing nephrology as an outpatient after discharge She is aware that she has to stop taking metformin and hydrochlorothiazide  ROS:  CONSTITUTIONAL: Denies fevers, chills. Denies any fatigue, weakness.  EYES: Denies blurry vision, double vision, eye pain. EARS, NOSE, THROAT: Denies tinnitus, ear pain, hearing loss. RESPIRATORY: Denies cough, wheeze, shortness of breath.  CARDIOVASCULAR: Denies chest pain, palpitations, edema.  GASTROINTESTINAL: Denies nausea, vomiting, diarrhea, abdominal pain. Denies bright red blood per rectum. GENITOURINARY: Denies dysuria, hematuria. ENDOCRINE: Denies nocturia or thyroid problems. HEMATOLOGIC AND LYMPHATIC: Denies easy  bruising or bleeding. SKIN: Denies rash or lesion. MUSCULOSKELETAL: Denies pain in neck, back, shoulder, knees, hips or arthritic symptoms.  NEUROLOGIC: Denies paralysis, paresthesias.  PSYCHIATRIC: Denies anxiety or depressive symptoms.   VITAL SIGNS:  Blood pressure (!) 94/51, pulse 67, temperature 98.4 F (36.9 C), temperature source Oral, resp. rate 20, height 4\' 11"  (1.499 m), weight 52.9 kg, SpO2 100 %.  I/O:    Intake/Output Summary (Last 24 hours) at 05/28/2018 1346 Last data filed at 05/28/2018 0315 Gross per 24 hour  Intake 1081.43 ml  Output -  Net 1081.43 ml    PHYSICAL EXAMINATION:  GENERAL:  83 y.o.-year-old patient lying in the bed with no acute distress.  EYES: Pupils equal, round, reactive to light and  accommodation. No scleral icterus. Extraocular muscles intact.  HEENT: Head atraumatic, normocephalic. Oropharynx and nasopharynx clear.  NECK:  Supple, no jugular venous distention. No thyroid enlargement, no tenderness.  LUNGS: Normal breath sounds bilaterally, no wheezing, rales,rhonchi or crepitation. No use of accessory muscles of respiration.  CARDIOVASCULAR: S1, S2 normal. No murmurs, rubs, or gallops.  ABDOMEN: Soft, non-tender, non-distended. Bowel sounds present. No organomegaly or mass.  EXTREMITIES: No pedal edema, cyanosis, or clubbing.  NEUROLOGIC: Cranial nerves II through XII are intact. Muscle strength 5/5 in all extremities. Sensation intact. Gait not checked.  PSYCHIATRIC: The patient is alert and oriented x 3.  SKIN: No obvious rash, lesion, or ulcer.   DATA REVIEW:   CBC Recent Labs  Lab 05/28/18 0514  WBC 11.7*  HGB 9.3*  HCT 30.0*  PLT 130*    Chemistries  Recent Labs  Lab 05/28/18 0514  NA 139  K 4.9  CL 111  CO2 23  GLUCOSE 158*  BUN 55*  CREATININE 2.34*  CALCIUM 8.3*    Cardiac Enzymes Recent Labs  Lab 05/26/18 0459  TROPONINI <0.03    Microbiology Results  Results for orders placed or performed during the hospital encounter of 05/26/18  Urine Culture     Status: Abnormal (Preliminary result)   Collection Time: 05/26/18  4:59 AM  Result Value Ref Range Status   Specimen Description   Final    URINE, CLEAN CATCH Performed at [facility], [address], [place], [place] [address]    Special Requests   Final    Normal Performed at [facility], [address] [place],  [address]    Culture >=100,000 COLONIES/mL KLEBSIELLA PNEUMONIAE (A)  Final   Report Status PENDING  Incomplete    RADIOLOGY:  US Renal  Result Date: 05/28/2018 CLINICAL DATA:  Acute kidney injury. EXAM: RENAL / URINARY TRACT ULTRASOUND COMPLETE COMPARISON:  Renal ultrasound dated Nov 22, 2016. FINDINGS: Right Kidney: Renal  measurements: 10.1 x 4.8 x 5.4 cm = volume: 135 mL. Increased echogenicity. No mass or hydronephrosis visualized. Mild fullness of the renal pelvis. Unchanged 1 cm simple cyst. Left Kidney: Renal measurements: 9.5 x 4.3 x 4.8 cm = volume: 102 mL. Increased echogenicity. No mass or hydronephrosis visualized. Two simple cysts measuring up to 1.3 cm are unchanged. Bladder: Appears normal for degree of bladder distention. IMPRESSION: 1. No acute abnormality. 2. Persistent increased renal cortical echogenicity, consistent with medical renal disease. 3. Unchanged bilateral renal cysts. Electronically Signed   By: Titus Dubin M.D.   On: 05/28/2018 11:55    EKG:   Orders placed or performed during the hospital encounter of 05/26/18  . ED EKG  . ED EKG  . EKG 12-Lead  . EKG 12-Lead  .  EKG 12-Lead  . EKG 12-Lead      Management plans discussed with the patient, family and they are in agreement.  CODE STATUS:     Code Status Orders  (From admission, onward)         Start     Ordered   05/26/18 0846  Full code  Continuous     05/26/18 0845        Code Status History    Date Active Date Inactive Code Status Order ID Comments User Context   01/25/2017 2224 01/28/2017 1728 Full Code [id number]  Lance Coon, MD Inpatient      TOTAL TIME TAKING CARE OF THIS PATIENT: 43  minutes.   Note: This dictation was prepared with Dragon dictation along with smaller phrase technology. Any transcriptional errors that result from this process are unintentional.   @[facility] @  on 05/28/2018 at 1:46 PM  Between 7am to 6pm - Pager - [phone]  After 6pm go to [email or web address] - password [email or web address] [facility]  Office  [phone]  CC: Primary care physician; Rusty Aus, MD

## 2018-05-29 LAB — URINE CULTURE
Culture: >=100000 — AB
Special Requests: NORMAL

## 2018-11-17 ENCOUNTER — Other Ambulatory Visit: Payer: Self-pay | Admitting: Oncology

## 2018-11-17 DIAGNOSIS — C50511 Malignant neoplasm of lower-outer quadrant of right female breast: Secondary | ICD-10-CM

## 2018-12-03 ENCOUNTER — Other Ambulatory Visit: Payer: Self-pay | Admitting: Oncology

## 2018-12-03 DIAGNOSIS — C50511 Malignant neoplasm of lower-outer quadrant of right female breast: Secondary | ICD-10-CM

## 2018-12-19 ENCOUNTER — Other Ambulatory Visit: Payer: Self-pay

## 2018-12-19 DIAGNOSIS — C50511 Malignant neoplasm of lower-outer quadrant of right female breast: Secondary | ICD-10-CM

## 2018-12-19 MED ORDER — TAMOXIFEN CITRATE 20 MG PO TABS: 20.0000 mg | ORAL_TABLET | Freq: Every day | ORAL | 3 refills | Status: AC

## 2019-03-19 ENCOUNTER — Ambulatory Visit
Admission: RE | Admit: 2019-03-19 | Discharge: 2019-03-19 | Disposition: A | Discharge status: Home / Self Care | Payer: Medicare Other | Source: Ambulatory Visit | Attending: Radiation Oncology | Admitting: Radiation Oncology

## 2019-03-19 ENCOUNTER — Other Ambulatory Visit: Payer: Self-pay | Admitting: *Deleted

## 2019-03-19 ENCOUNTER — Encounter: Payer: Self-pay | Admitting: Radiation Oncology

## 2019-03-19 ENCOUNTER — Other Ambulatory Visit: Payer: Self-pay

## 2019-03-19 DIAGNOSIS — C50411 Malignant neoplasm of upper-outer quadrant of right female breast: Secondary | ICD-10-CM

## 2019-03-19 DIAGNOSIS — Z170 Estrogen receptor positive status [ER+]: Secondary | ICD-10-CM | POA: Diagnosis not present

## 2019-03-19 DIAGNOSIS — Z923 Personal history of irradiation: Secondary | ICD-10-CM | POA: Insufficient documentation

## 2019-03-19 DIAGNOSIS — Z79810 Long term (current) use of selective estrogen receptor modulators (SERMs): Secondary | ICD-10-CM | POA: Insufficient documentation

## 2019-03-19 NOTE — Progress Notes (Signed)
Radiation Oncology Follow up Note  Name: Pamela Hensley   Date:   03/19/2019 MRN:  [id number] DOB: 07-14-1934    This 84 y.o. 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  REFERRING PROVIDER: Rusty Aus, MD  HPI: Patient is a 84 year old female now out 2 years having completed whole breast radiation to her right breast for ER PR positive stage I invasive mammary carcinoma.  Seen today in routine follow-up she is doing well.  She specifically denies breast tenderness cough or bone pain.  Her last mammogram.  Was back in April 2019 BI-RADS 2 benign.  I have ordered diagnostic mammograms on both breasts.  She is currently on tamoxifen tolerating that well without side effect.  COMPLICATIONS OF TREATMENT: none  FOLLOW UP COMPLIANCE: keeps appointments   PHYSICAL EXAM:  BP (P) 122/68 (BP Location: Left Arm, Patient Position: Sitting)   Pulse (!) (P) 47   Temp (P) 97.9 F (36.6 C) (Tympanic)   Resp (P) 16   Wt (P) 110 lb (49.9 kg)   BMI (P) 22.22 kg/m  Lungs are clear to A&P cardiac examination essentially unremarkable with regular rate and rhythm. No dominant mass or nodularity is noted in either breast in 2 positions examined. Incision is well-healed. No axillary or supraclavicular adenopathy is appreciated. Cosmetic result is excellent.  Well-developed well-nourished patient in NAD. HEENT reveals PERLA, EOMI, discs not visualized.  Oral cavity is clear. No oral mucosal lesions are identified. Neck is clear without evidence of cervical or supraclavicular adenopathy. Lungs are clear to A&P. Cardiac examination is essentially unremarkable with regular rate and rhythm without murmur rub or thrill. Abdomen is benign with no organomegaly or masses noted. Motor sensory and DTR levels are equal and symmetric in the upper and lower extremities. Cranial nerves II through XII are  grossly intact. Proprioception is intact. No peripheral adenopathy or edema is identified. No motor or sensory levels are noted. Crude visual fields are within normal range.  RADIOLOGY RESULTS: Mammograms reviewed compatible with above-stated findings  PLAN: Present time patient is doing well with no evidence of disease I have ordered her diagnostic mammogram since is been quite a while.  Otherwise I am pleased with her overall progress have asked to see her back in 1 year for follow-up.  Patient knows to call with any concerns.  She continues on tamoxifen without side effect.  I would like to take this opportunity to thank you for allowing me to participate in the care of your patient.Noreene Filbert, MD

## 2019-04-30 ENCOUNTER — Ambulatory Visit
Admission: RE | Admit: 2019-04-30 | Discharge: 2019-04-30 | Disposition: A | Discharge status: Home / Self Care | Payer: Medicare Other | Source: Ambulatory Visit | Attending: Radiation Oncology | Admitting: Radiation Oncology

## 2019-04-30 DIAGNOSIS — C50411 Malignant neoplasm of upper-outer quadrant of right female breast: Secondary | ICD-10-CM | POA: Diagnosis not present

## 2019-04-30 DIAGNOSIS — Z170 Estrogen receptor positive status [ER+]: Secondary | ICD-10-CM | POA: Insufficient documentation

## 2019-09-07 DEATH — deceased

## 2020-03-27 ENCOUNTER — Encounter: Payer: Self-pay | Admitting: Radiation Oncology

## 2020-03-31 ENCOUNTER — Ambulatory Visit: Payer: Medicare Other | Admitting: Radiation Oncology
